# Patient Record
Sex: Female | Born: 2000 | Hispanic: Yes | Marital: Single | Smoking: Never smoker
Health system: Southern US, Community
[De-identification: ages and names within clinical notes are randomized; demographics above are authoritative.]

## PROBLEM LIST (undated history)

## (undated) DIAGNOSIS — Z789 Other specified health status: Secondary | ICD-10-CM

## (undated) HISTORY — PX: NO PAST SURGERIES: SHX2092

---
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
## 2019-01-08 NOTE — L&D Delivery Note (Signed)
Delivery Note  [name] is a G1P0101 at [redacted]w[redacted]d. Patient's last menstrual period was 06/10/2018. which is consistent with early [place] at 6 weeks, Estimated Date of Delivery: 03/17/19.   First Stage: Labor onset: 02/22/19 at 2300 Augmentation : AROM Analgesia /Anesthesia intrapartum: none AROM at 0821  Second Stage: Complete dilation at 0833 Onset of pushing at 0836 FHR second stage 140 with moderate variability, accelerations present, intermittent variables   [name] presented to L&D with regular contractions. She progressed to c/c/+2 and had excellent maternal effort with pushing.  Delivery of a viable boy 02/23/19 at 0849 by CNM, Dr. Feliberto Gottron at bedside, delivery of fetal head in OA position with restitution to LOT. Delivered through loose shoulder cord;  Anterior then posterior shoulders delivered easily with gentle downward traction. Baby placed on mom's chest, and attended to by peds.  Cord double clamped after cessation of pulsation, cut by grandmother of baby Cord blood sample collected   Arterial cord blood sample: NA  Third Stage: Placenta delivered [name] side intact with 3 VC @ 0853 Placenta disposition: discarded Uterine tone firm / bleeding moderate  Left periurethral and hymenal laceration identified  Anesthesia for repair: lidocaine 1% for local Repair with 3-0 Vicryl SH x 2 Est. Blood Loss (mL): 565  Complications: None  Mom to postpartum.  Baby to Couplet care / Skin to Skin.  Newborn: Birth Weight: 5lbs 11oz (2580 grams)  Apgar Scores: 8/9 Feeding planned: breast   [name], CNM

## 2019-02-18 LAB — OB RESULTS CONSOLE VARICELLA ZOSTER ANTIBODY, IGG: Varicella: IMMUNE

## 2019-02-18 LAB — OB RESULTS CONSOLE GBS: GBS: NEGATIVE

## 2019-02-18 LAB — OB RESULTS CONSOLE RPR: RPR: NONREACTIVE

## 2019-02-18 LAB — OB RESULTS CONSOLE RUBELLA ANTIBODY, IGM: Rubella: UNDETERMINED

## 2019-02-18 LAB — OB RESULTS CONSOLE GC/CHLAMYDIA
Chlamydia: NEGATIVE
Gonorrhea: NEGATIVE

## 2019-02-18 LAB — OB RESULTS CONSOLE HIV ANTIBODY (ROUTINE TESTING): HIV: NONREACTIVE

## 2019-02-23 ENCOUNTER — Other Ambulatory Visit: Payer: Self-pay

## 2019-02-23 ENCOUNTER — Inpatient Hospital Stay
Admission: EM | Admit: 2019-02-23 | Discharge: 2019-02-24 | DRG: 806 | Disposition: A | Discharge status: Home / Self Care | Payer: Medicaid Other | Attending: Obstetrics & Gynecology | Admitting: Obstetrics & Gynecology

## 2019-02-23 DIAGNOSIS — O26893 Other specified pregnancy related conditions, third trimester: Secondary | ICD-10-CM | POA: Diagnosis present

## 2019-02-23 DIAGNOSIS — Z20822 Contact with and (suspected) exposure to covid-19: Secondary | ICD-10-CM | POA: Diagnosis present

## 2019-02-23 DIAGNOSIS — Z3A36 36 weeks gestation of pregnancy: Secondary | ICD-10-CM

## 2019-02-23 DIAGNOSIS — O9081 Anemia of the puerperium: Secondary | ICD-10-CM | POA: Diagnosis not present

## 2019-02-23 DIAGNOSIS — D62 Acute posthemorrhagic anemia: Secondary | ICD-10-CM | POA: Diagnosis not present

## 2019-02-23 HISTORY — DX: Other specified health status: Z78.9

## 2019-02-23 LAB — CBC
HCT: 39.7 % (ref 36.0–46.0)
Hemoglobin: 13.4 g/dL (ref 12.0–15.0)
MCH: 30.1 pg (ref 26.0–34.0)
MCHC: 33.8 g/dL (ref 30.0–36.0)
MCV: 89.2 fL (ref 80.0–100.0)
Platelets: 161 10*3/uL (ref 150–400)
RBC: 4.45 MIL/uL (ref 3.87–5.11)
RDW: 12.1 % (ref 11.5–15.5)
WBC: 15.4 10*3/uL — ABNORMAL HIGH (ref 4.0–10.5)
nRBC: 0 % (ref 0.0–0.2)

## 2019-02-23 LAB — URINE DRUG SCREEN, QUALITATIVE (ARMC ONLY)
Amphetamines, Ur Screen: NOT DETECTED
Barbiturates, Ur Screen: NOT DETECTED
Benzodiazepine, Ur Scrn: NOT DETECTED
Cannabinoid 50 Ng, Ur ~~LOC~~: NOT DETECTED
Cocaine Metabolite,Ur ~~LOC~~: NOT DETECTED
MDMA (Ecstasy)Ur Screen: NOT DETECTED
Methadone Scn, Ur: NOT DETECTED
Opiate, Ur Screen: NOT DETECTED
Phencyclidine (PCP) Ur S: NOT DETECTED
Tricyclic, Ur Screen: NOT DETECTED

## 2019-02-23 LAB — TYPE AND SCREEN
ABO/RH(D): O POS
Antibody Screen: NEGATIVE

## 2019-02-23 LAB — RESPIRATORY PANEL BY RT PCR (FLU A&B, COVID)
Influenza A by PCR: NEGATIVE
Influenza B by PCR: NEGATIVE
SARS Coronavirus 2 by RT PCR: NEGATIVE

## 2019-02-23 MED ORDER — DIBUCAINE (PERIANAL) 1 % EX OINT
1.0000 "application " | TOPICAL_OINTMENT | CUTANEOUS | Status: DC | PRN
  Filled 2019-02-23 (×2): qty 28

## 2019-02-23 MED ORDER — OXYTOCIN 10 UNIT/ML IJ SOLN
INTRAMUSCULAR | Status: AC
  Filled 2019-02-23: qty 2

## 2019-02-23 MED ORDER — WITCH HAZEL-GLYCERIN EX PADS
1.0000 "application " | MEDICATED_PAD | CUTANEOUS | Status: DC
  Administered 2019-02-23: 1 via TOPICAL
  Filled 2019-02-23 (×2): qty 100

## 2019-02-23 MED ORDER — ONDANSETRON HCL 4 MG PO TABS: 4.0000 mg | ORAL_TABLET | ORAL | Status: DC | PRN

## 2019-02-23 MED ORDER — MISOPROSTOL 200 MCG PO TABS
ORAL_TABLET | ORAL | Status: AC
  Filled 2019-02-23: qty 4

## 2019-02-23 MED ORDER — OXYTOCIN 40 UNITS IN NORMAL SALINE INFUSION - SIMPLE MED
INTRAVENOUS | Status: AC
  Filled 2019-02-23: qty 1000

## 2019-02-23 MED ORDER — LIDOCAINE HCL (PF) 1 % IJ SOLN
INTRAMUSCULAR | Status: AC
  Filled 2019-02-23: qty 30

## 2019-02-23 MED ORDER — OXYTOCIN 40 UNITS IN NORMAL SALINE INFUSION - SIMPLE MED
2.5000 [IU]/h | INTRAVENOUS | Status: DC
  Administered 2019-02-23: 09:00:00 2.5 [IU]/h via INTRAVENOUS

## 2019-02-23 MED ORDER — BENZOCAINE-MENTHOL 20-0.5 % EX AERO
INHALATION_SPRAY | CUTANEOUS | Status: AC
  Filled 2019-02-23: qty 56

## 2019-02-23 MED ORDER — IBUPROFEN 600 MG PO TABS: 600.0000 mg | ORAL_TABLET | Freq: Four times a day (QID) | ORAL | Status: DC

## 2019-02-23 MED ORDER — LIDOCAINE HCL (PF) 1 % IJ SOLN
30.0000 mL | INTRAMUSCULAR | Status: AC | PRN
  Administered 2019-02-23: 30 mL via SUBCUTANEOUS

## 2019-02-23 MED ORDER — ONDANSETRON HCL 4 MG/2ML IJ SOLN: 4.0000 mg | INTRAMUSCULAR | Status: DC | PRN

## 2019-02-23 MED ORDER — OXYTOCIN BOLUS FROM INFUSION
500.0000 mL | Freq: Once | INTRAVENOUS | Status: AC
  Administered 2019-02-23: 09:00:00 500 mL via INTRAVENOUS

## 2019-02-23 MED ORDER — COCONUT OIL OIL
1.0000 "application " | TOPICAL_OIL | Status: DC | PRN
  Filled 2019-02-23: qty 120

## 2019-02-23 MED ORDER — LACTATED RINGERS IV SOLN: INTRAVENOUS | Status: DC

## 2019-02-23 MED ORDER — ACETAMINOPHEN 325 MG PO TABS: 650.0000 mg | ORAL_TABLET | ORAL | Status: DC | PRN

## 2019-02-23 MED ORDER — DOCUSATE SODIUM 100 MG PO CAPS: 100.0000 mg | ORAL_CAPSULE | Freq: Two times a day (BID) | ORAL | Status: DC

## 2019-02-23 MED ORDER — PRENATAL MULTIVITAMIN CH
1.0000 | ORAL_TABLET | Freq: Every day | ORAL | Status: DC
  Administered 2019-02-24: 12:00:00 1 via ORAL
  Filled 2019-02-23: qty 1

## 2019-02-23 MED ORDER — ACETAMINOPHEN 500 MG PO TABS
1000.0000 mg | ORAL_TABLET | Freq: Four times a day (QID) | ORAL | Status: DC | PRN
  Filled 2019-02-23: qty 2

## 2019-02-23 MED ORDER — LACTATED RINGERS IV SOLN: 500.0000 mL | INTRAVENOUS | Status: DC | PRN

## 2019-02-23 MED ORDER — MEASLES, MUMPS & RUBELLA VAC IJ SOLR
0.5000 mL | Freq: Once | INTRAMUSCULAR | Status: AC
  Administered 2019-02-24: 0.5 mL via SUBCUTANEOUS
  Filled 2019-02-23 (×2): qty 0.5

## 2019-02-23 MED ORDER — AMMONIA AROMATIC IN INHA
RESPIRATORY_TRACT | Status: AC
  Filled 2019-02-23: qty 10

## 2019-02-23 MED ORDER — SIMETHICONE 80 MG PO CHEW: 80.0000 mg | CHEWABLE_TABLET | ORAL | Status: DC | PRN

## 2019-02-23 MED ORDER — SOD CITRATE-CITRIC ACID 500-334 MG/5ML PO SOLN: 30.0000 mL | ORAL | Status: DC | PRN

## 2019-02-23 MED ORDER — OXYCODONE-ACETAMINOPHEN 5-325 MG PO TABS: 1.0000 | ORAL_TABLET | ORAL | Status: DC | PRN

## 2019-02-23 MED ORDER — FENTANYL CITRATE (PF) 100 MCG/2ML IJ SOLN: 50.0000 ug | INTRAMUSCULAR | Status: DC | PRN

## 2019-02-23 MED ORDER — ONDANSETRON HCL 4 MG/2ML IJ SOLN: 4.0000 mg | Freq: Four times a day (QID) | INTRAMUSCULAR | Status: DC | PRN

## 2019-02-23 MED ORDER — DIPHENHYDRAMINE HCL 25 MG PO CAPS: 25.0000 mg | ORAL_CAPSULE | Freq: Four times a day (QID) | ORAL | Status: DC | PRN

## 2019-02-23 MED ORDER — OXYCODONE-ACETAMINOPHEN 5-325 MG PO TABS: 2.0000 | ORAL_TABLET | ORAL | Status: DC | PRN

## 2019-02-23 MED ORDER — BENZOCAINE-MENTHOL 20-0.5 % EX AERO
1.0000 "application " | INHALATION_SPRAY | CUTANEOUS | Status: DC | PRN
  Filled 2019-02-23 (×2): qty 56

## 2019-02-23 NOTE — H&P (Signed)
OB History & Physical   History of Present Illness:  Chief Complaint:   HPI:  [name] is a 19 y.o. G1P0 female at [redacted]w[redacted]d dated by LMP (06/10/18) and consistent with a 6 week u/s.  She presents to L&D for labor evaluation.  She reports UC all day yesterday however they became q46min about 2300 last night.   She reports:  -active fetal movement -no leakage of fluid   -no vaginal bleeding -onset of contractions at 0830 yesterday and currently every 3 minutes  Pregnancy Issues: 1. Rubella Equivocal 2. Elevated 1h OGTT, 3h WNL  Maternal Medical History:   Past Medical History:  Diagnosis Date  . Medical history non-contributory     History reviewed. No pertinent surgical history.  No Known Allergies  Prior to Admission medications   Medication Sig Start Date End Date Taking? Authorizing Provider  Prenatal Vit-Fe Fumarate-FA (MULTIVITAMIN-PRENATAL) 27-0.8 MG TABS tablet Take 1 tablet by mouth daily at 12 noon.   Yes [provider]     Prenatal care site: [facility]   Social History: She  reports that she has never smoked. She has never used smokeless tobacco. She reports previous alcohol use. She reports previous drug use.  Family History: family history is not on file.   Review of Systems: A full review of systems was performed and negative except as noted in the HPI.    Physical Exam:  Vital Signs: Pulse 83   Temp 98.7 F (37.1 C) (Oral)   Resp 18   Ht 5' (1.524 m)   Wt 62.6 kg   LMP 06/10/2018   BMI 26.95 kg/m   General:   alert and cooperative  Skin:  normal  Neurologic:    Alert & oriented x 3  Lungs:   nl effort  Heart:   regular rate and rhythm  Abdomen:  non-tender, soft between contractions  Pelvis:  Exam deferred.  FHT:  145 BPM  Presentations: cephalic  Cervix:    Dilation: 4 cm   Effacement: 80%   Station:  -2   Consistency: soft   Position: middle  Extremities: : non-tender, symmetric     No results found  for this or any previous visit (from the past 24 hour(s)).  Pertinent Results:  Prenatal Labs: Blood type/Rh O pos  Antibody screen neg  Rubella Equivocal  Varicella Immune  RPR NR  HBsAg Neg  HIV NR  GC neg  Chlamydia neg  Genetic screening negative  1 hour GTT 169  3 hour GTT WNL 93, 155, 127, 115  GBS neg   FHT: FHR: 145 bpm, variability: moderate,  accelerations:  Present,  decelerations:  Absent Category/reactivity:  Category I TOCO: regular, every 2 minutes SVE: Dilation: 6.5 / Effacement (%): 90 / Station: -1    Cephalic by SVE  No results found.   Assessment:  [name] is a 19 y.o. G1P0 female at [redacted]w[redacted]d with contractions admitted in labor.   Plan:  1. Admit to Labor & Delivery; consents reviewed and obtained  2. Fetal Well being  - Fetal Tracing: Cat I - GBS neg - Presentation: vtx confirmed by SVE   3. Routine OB: - Prenatal labs reviewed, as above - Rh pos - CBC & T&S on admit - Clear fluids, IVF  4. Monitoring of Labor -  Contractions monitor with external toco in place -  Plan for continuous fetal monitoring  -  Maternal pain control as desired: Pt plans to labor without  epidural - Anticipate vaginal delivery  5. Post Partum Planning: - Infant feeding: Breast - Contraception: Condoms  Deserai Cansler, CNM 02/23/2019 5:09 AM

## 2019-02-23 NOTE — OB Triage Note (Signed)
Pt 18, G1P0, [redacted]w[redacted]d, presents to L&D w/ c/o ctxs q5 min apart. Reports she has been feeling irregular ctxs since 8:30 am 02/22/2019. Reports pain as a 10/10. Pt denies vaginal bleeding and LOF. Reports fetal movement. Monitors applied and assessing, FHT 150bpm, VSS. Will continue to monitor.

## 2019-02-23 NOTE — Lactation Note (Signed)
This note was copied from a baby's chart. Lactation Consultation Note  Patient Name: [name] [id number] Date: 02/23/2019 Reason for consult: Initial assessment;Primapara;1st time breastfeeding;Late-preterm 34-36.6wks;Infant < 6lbs  LC called into LDR2 by Transition to assist first time mom with breastfeeding. Mom delivered SVD 45 minutes prior, to baby Beth at 36 weeks 6 days gestation. Baby was skin to skin on mom's chest displaying early hunger cues. LC assisted and talked through steps in transitioning baby to left breast in modified cross-cradle hold, sandwiching of breast tissue. Baby [name] latched easily with flanged top and bottom lips, LC continued to compress and massage the breast tissue as [name] displayed strong rhythmic sucking pattern with a few audible swallows. Mom educated on typical newborn feeding behaviors: on/off breast, multiple attempts to latch, sandwiching tissue for deep latch. Reviewed early hunger cues, feeding on cue, and benefits of skin to skin.Baby fed well on both left and right breasts, mom reporting tugging sensation and no discomfort. LC will follow-up with mom once moved to [facility]. Transition RN updated on feeding.  Maternal Data Formula Feeding for Exclusion: No Has patient been taught Hand Expression?: Yes Does the patient have breastfeeding experience prior to this delivery?: No  Feeding Feeding Type: Breast Fed  LATCH Score Latch: Grasps breast easily, tongue down, lips flanged, rhythmical sucking.  Audible Swallowing: A few with stimulation  Type of Nipple: Everted at rest and after stimulation  Comfort (Breast/Nipple): Soft / non-tender  Hold (Positioning): Assistance needed to correctly position infant at breast and maintain latch.  LATCH Score: 8  Interventions Interventions: Breast feeding basics reviewed;Assisted with latch;Breast massage;Hand express;Breast compression;Adjust position;Support pillows  Lactation Tools  Discussed/Used     Consult Status Consult Status: Follow-up Date: 02/23/19 Follow-up type: In-patient    Danford Bad 02/23/2019, 10:15 AM

## 2019-02-23 NOTE — Plan of Care (Signed)
Patient had successful vaginal delivery at 0849 this morning to baby boy. Patient recovering well in Labor and Delivery and planning to transfer mom and baby boy to mother baby for couplet care. Pain managed well and all questions answered by RN.

## 2019-02-23 NOTE — Lactation Note (Signed)
This note was copied from a baby's chart. Lactation Consultation Note  Patient Name: [name] [id number] Date: 02/23/2019 Reason for consult: Follow-up assessment;Mother's request;1st time breastfeeding;Primapara;Late-preterm 34-36.6wks;Infant < 6lbs  Mom called out for [facility] support in positioning and latching of [name]. Mom had attempted earlier, but says he was too sleepy. LC assisted mom in getting comfortable, placed support pillows for cross-cradle hold, and assisted with unwrapping baby. Mom was able to independently get baby to breast, and use opposite hand for sandwiching of the tissue. A few attempts with latching, a couple of strong sucks, and [name] appeared to fall asleep at the breast. LC educated mom on typical newborn feeding patterns, void/stool expectations for first 24 hours, and attempts 8-12x a day. Discussed benefits of implementing a DEBP due to baby's age, tendency to be sleepier at the breast, and differences in suction ability with age versus 40 weekers. Mom understanding the benefit of pumping but desires to wait a little so she can rest. LC acknowledged request; will update RN.  Maternal Data Formula Feeding for Exclusion: No Has patient been taught Hand Expression?: Yes Does the patient have breastfeeding experience prior to this delivery?: No  Feeding Feeding Type: Breast Fed  LATCH Score Latch: Too sleepy or reluctant, no latch achieved, no sucking elicited.  Audible Swallowing: None  Type of Nipple: Everted at rest and after stimulation  Comfort (Breast/Nipple): Soft / non-tender  Hold (Positioning): Assistance needed to correctly position infant at breast and maintain latch.  LATCH Score: 5  Interventions Interventions: Breast feeding basics reviewed;Assisted with latch;Skin to skin;Adjust position;Breast compression;Support pillows  Lactation Tools Discussed/Used     Consult Status Consult Status: Follow-up Date: 02/23/19 Follow-up  type: In-patient    Danford Bad 02/23/2019, 3:33 PM

## 2019-02-23 NOTE — Discharge Summary (Signed)
Obstetrical Discharge Summary  Patient Name: [name] DOB: 06/29/2000 MRN: [id number]  Date of Admission: 02/23/2019 Date of Delivery: 02/23/2019 Delivered by: Drinda Butts, CNM, Dr. Ouida Sills present for birth  Date of Discharge: 02/24/2019  Primary OB: [place] URK:[id number]'S last menstrual period was 06/10/2018. EDC Estimated Date of Delivery: 03/17/19 Gestational Age at Delivery: [redacted]w[redacted]d   Antepartum complications:  1. Preterm labor at [redacted]w[redacted]d 2. Rubella Equivocal  3. Elevated 1 hour OGTT - 3hr normal Admitting Diagnosis:  Preterm labor at [redacted]w[redacted]d Secondary Diagnosis: Patient Active Problem List   Diagnosis Date Noted  . Normal labor 02/23/2019   Augmentation: AROM Complications: None Intrapartum complications/course: [name] present to L&D with regular contractions and preterm labor at [redacted]w[redacted]d.  She progressed to c/c/+2, unmedicated, good maternal effort with pushing.  SVD of viable baby boy.   Delivery Type: spontaneous vaginal delivery Anesthesia: Local anesthetic for repair  Placenta: spontaneous Laceration: left hymenal laceration, left periurethral w/ repeair  Episiotomy: none Newborn Data: Live born female Birth Weight: 5lbs 11oz (2831D) APGAR: 8, 9  Newborn Delivery   Birth date/time: 02/23/2019 08:49:00 Delivery type: Vaginal, Spontaneous     Postpartum Procedures: none  Post partum course:  Patient had an uncomplicated postpartum course.  By time of discharge on PPD#1, her pain was controlled on oral pain medications; she had appropriate lochia and was ambulating, voiding without difficulty and tolerating regular diet.  She was deemed stable for discharge to home.     Discharge Physical Exam:  BP 104/63 (BP Location: Left Arm)   Pulse 84   Temp 98 F (36.7 C) (Oral)   Resp 16   Ht 5' (1.524 m)   Wt 62.6 kg   LMP 06/10/2018   SpO2 100%   Breastfeeding Unknown   BMI 26.95 kg/m   General: NAD CV: RRR Pulm: CTABL, nl effort ABD:  s/nd/nt, fundus firm and below the umbilicus Lochia: moderate DVT Evaluation: LE non-ttp, no evidence of DVT on exam.  Hemoglobin  Date Value Ref Range Status  02/24/2019 11.5 (L) 12.0 - 15.0 g/dL Final   HCT  Date Value Ref Range Status  02/24/2019 34.2 (L) 36.0 - 46.0 % Final   Disposition: stable, discharge to home. Baby Feeding: breastmilk Baby Disposition: home with mom  Rh Immune globulin given: N/A Pos RH Rubella vaccine given: ordered to be given Tdap vaccine given in AP or PP setting: Tdap given 12/24/18 Flu vaccine given in AP or PP setting: Flu given 10/20/18  Contraception: condoms  Prenatal Labs:  Blood type/Rh O pos  Antibody screen neg  Rubella Equivocal  Varicella Immune  RPR NR  HBsAg Neg  HIV NR  GC neg  Chlamydia neg  Genetic screening negative  1 hour GTT 169  3 hour GTT WNL 93, 155, 127, 115  GBS neg    Plan:  [name] was discharged to home in good condition. Follow-up appointment with delivering provider in 6 weeks.  Discharge Medications: Allergies as of 02/24/2019   No Known Allergies     Medication List    TAKE these medications   acetaminophen 500 MG tablet Commonly known as: TYLENOL Take 2 tablets (1,000 mg total) by mouth every 6 (six) hours as needed for mild pain or moderate pain.   ibuprofen 600 MG tablet Commonly known as: ADVIL Take 1 tablet (600 mg total) by mouth every 6 (six) hours as needed for mild pain, moderate pain or cramping.   multivitamin-prenatal 27-0.8 MG Tabs tablet Take 1  tablet by mouth daily at 12 noon.       Follow-up Information    Gustavo Lah, CNM Follow up in 6 week(s).   Specialty: Certified Nurse Midwife Contact information: [address] [place] [address] [phone]          Signed: Genia Del, CNM 02/24/2019 11:15 AM

## 2019-02-24 LAB — CBC
HCT: 34.2 % — ABNORMAL LOW (ref 36.0–46.0)
Hemoglobin: 11.5 g/dL — ABNORMAL LOW (ref 12.0–15.0)
MCH: 30.5 pg (ref 26.0–34.0)
MCHC: 33.6 g/dL (ref 30.0–36.0)
MCV: 90.7 fL (ref 80.0–100.0)
Platelets: 153 10*3/uL (ref 150–400)
RBC: 3.77 MIL/uL — ABNORMAL LOW (ref 3.87–5.11)
RDW: 12.2 % (ref 11.5–15.5)
WBC: 12.3 10*3/uL — ABNORMAL HIGH (ref 4.0–10.5)
nRBC: 0 % (ref 0.0–0.2)

## 2019-02-24 LAB — RPR: RPR Ser Ql: NONREACTIVE

## 2019-02-24 MED ORDER — IBUPROFEN 600 MG PO TABS: 600.0000 mg | ORAL_TABLET | Freq: Four times a day (QID) | ORAL | 0 refills | Status: DC | PRN

## 2019-02-24 MED ORDER — ACETAMINOPHEN 500 MG PO TABS: 1000.0000 mg | ORAL_TABLET | Freq: Four times a day (QID) | ORAL | 0 refills | Status: DC | PRN

## 2019-02-24 NOTE — Discharge Instructions (Signed)
After Your Delivery Discharge Instructions   Postpartum: Care Instructions  After childbirth (postpartum period), your body goes through many changes. Some of these changes happen over several weeks. In the hours after delivery, your body will begin to recover from childbirth while it prepares to breastfeed your newborn. You may feel emotional during this time. Your hormones can shift your mood without warning for no clear reason.  In the first couple of weeks after childbirth, many women have emotions that change from happy to sad. You may find it hard to sleep. You may cry a lot. This is called the "baby blues." These overwhelming emotions often go away within a couple of days or weeks. But it's important to discuss your feelings with your doctor.  You should call your care provider if you have unrelieved feelings of:  Inability to cope  Sadness  Anxiety  Lack of interest in baby  Insomnia  Crying  It is easy to get too tired and overwhelmed during the first weeks after childbirth. Don't try to do too much. Get rest whenever you can, accept help from others, and eat well and drink plenty of fluids.  About 4 to 6 weeks after your baby's birth, you will have a follow-up visit with your care provider. This visit is your time to talk to your provider about anything you are concerned or curious about.  Follow-up care is a key part of your treatment and safety. Be sure to make and go to all appointments, and call your doctor if you are having problems. It's also a good idea to know your test results and keep a list of the medicines you take.  How can you care for yourself at home?  Sleep or rest when your baby sleeps.  Get help with household chores from family or friends, if you can. Do not try to do it all yourself.  If you have hemorrhoids or swelling or pain around the opening of your vagina, try using cold and heat. You can put ice or a cold pack on the area for 10 to 20 minutes at  a time. Put a thin cloth between the ice and your skin. Also try sitting in a few inches of warm water (sitz bath) 3 times a day and after bowel movements.  Take pain medicines exactly as directed.  If the provider gave you a prescription medicine for pain, take it as prescribed.  If you do not have a prescription and need something over the counter, you can take:  Ibuprofen (Motrin, Advil) up to 600mg  every 6 hours as needed for pain  Acetaminophen (Tylenol) up to 1000mg  every 6 hours as needed for pain  Some people find it helpful to alternate between these two medications.   No driving for 1-2 weeks or while taking pain medications.   Eat more fiber to avoid constipation. Include foods such as whole-grain breads and cereals, raw vegetables, raw and dried fruits, and beans.  Drink plenty of fluids, enough so that your urine is light yellow or clear like water. If you have kidney, heart, or liver disease and have to limit fluids, talk with your doctor before you increase the amount of fluids you drink.  Do not put anything in the vagina for 6 weeks. This means no sex, no tampons, no douching, and no enemas.  If you have stitches, keep the area clean by pouring or spraying warm water over the area outside your vagina and anus after you use the toilet.  No strenuous activity or heavy lifting for 6 weeks   No tub baths; showers only  Continue prenatal vitamin and iron.  If breastfeeding:  Increase calories and fluids while breastfeeding.  You may have a slight fever when your milk comes in, but it should go away on its own. If it does not, and rises above 101.0 please call the doctor.  For breastfeeding concerns, the lactation consultant can be reached at [phone].  For concerns about your baby, please call your pediatrician.   Keep a list of questions to bring to your postpartum visit. Your questions might be about:  Changes in your breasts, such as lumps or  soreness.  When to expect your menstrual period to start again.  What form of birth control is best for you.  Weight you have put on during the pregnancy.  Exercise options.  What foods and drinks are best for you, especially if you are breastfeeding.  Problems you might be having with breastfeeding.  When you can have sex. Some women may want to talk about lubricants for the vagina.  Any feelings of sadness or restlessness that you are having.   When should you call for help?  Call 911 anytime you think you may need emergency care. For example, call if:  You have thoughts of harming yourself, your baby, or another person.  You passed out (lost consciousness).  Call the office at [phone] or seek immediate medical care if:  If you have heavy bleeding such that you are soaking 1 pad in an hour for 2 hours  You are dizzy or lightheaded, or you feel like you may faint.  You have a fever; a temperature of 101.0 F or greater  Chills  Difficulty urinating  Headache unrelieved by "pain meds"   Visual changes  Pain in the right side of your belly near your ribs  Breasts reddened, hard, hot to the touch or any other breast concerns  Nipple discharge which is foul-smelling or contains pus   New pain unrelieved with recommended over-the-counter dosages  Difficulty breathing with or without chest pain   New leg pain, swelling, or redness, especially if it is only on one leg  Any other concerns  Watch closely for changes in your health, and be sure to contact your provider if:  You have new or worse vaginal discharge.  You feel sad or depressed.  You are having problems with your breasts or breastfeeding.       Breastfeeding Tips for a Good Latch Latching is how your baby's mouth attaches to your nipple to breastfeed. It is an important part of breastfeeding. Your baby may have trouble latching for a number of reasons. A poor latch may cause you to have  cracked or sore nipples or other problems. Follow these instructions at home: How to position your baby  Find a comfortable place to sit or lie down. Your neck and back should be well supported.  If you are seated, place a pillow or rolled-up blanket under your baby. This will bring him or her to the level of your breast.  Make sure that your baby's belly (abdomen) is facing your belly.  Try different positions to find one that works best for you and your baby. How to help your baby latch   To start, gently rub your breast. Move your fingertips in a circle as you massage from your chest wall toward your nipple. This helps milk flow. Keep doing this during feeding if needed.  Position your breast. Hold your breast with four fingers underneath and your thumb above your nipple. Keep your fingers away from your nipple and your baby's mouth. Follow these steps to help your baby latch: 1. Rub your baby's lips gently with your finger or nipple. 2. When your baby's mouth is open wide enough, quickly bring your baby to your breast and place your whole nipple into your baby's mouth. Place as much of the colored area around your nipple (areola)as possible into your baby's mouth. 3. Your baby's tongue should be between his or her lower gum and your breast. 4. You should be able to see more areola above your baby's upper lip than below the lower lip. 5. When your baby starts sucking, you will feel a gentle pull on your nipple. You should not feel any pain. Be patient. It is common for a baby to suck for about 2-3 minutes to start the flow of breast milk. 6. Make sure that your baby's mouth is in the right position around your nipple. Your baby's lips should make a seal on your breast and be turned outward.  General instructions  Look for these signs that your baby has latched on to your nipple: ? The baby is quietly tugging or sucking without causing you pain. ? You hear the baby swallow after every  3 or 4 sucks. ? You see movement above and in front of the baby's ears while he or she is sucking.  Be aware of these signs that your baby has not latched on to your nipple: ? The baby makes sucking sounds or smacking sounds while feeding. ? You have nipple pain.  If your baby is not latched well, put your little finger between your baby's gums and your nipple. This will break the seal. Then try to help your baby latch again.  If you keep having problems, get help from a breastfeeding specialist (Science writer). Contact a doctor if:  You have cracking or soreness in your nipples that lasts longer than 1 week.  You have nipple pain.  Your breasts are filled with too much milk (engorgement), and this does not improve after 48-72 hours.  You have a plugged milk duct and a fever.  You follow the tips for a good latch but you keep having problems or concerns.  You have a pus-like fluid coming from your breast.  Your baby is not gaining weight.  Your baby loses weight. Summary  Latching is how your baby's mouth attaches to your nipple to breastfeed.  Try different positions for breastfeeding to find one that works best for you and your baby.  A poor latch may cause you to have cracked or sore nipples or other problems. This information is not intended to replace advice given to you by your health care provider. Make sure you discuss any questions you have with your health care provider. Document Revised: 04/15/2018 Document Reviewed: 07/31/2016 Elsevier Patient Education  [place].  Vaginal Delivery, Care After Refer to this sheet in the next few weeks. These discharge instructions provide you with information on caring for yourself after delivery. Your caregiver may also give you specific instructions. Your treatment has been planned according to the most current medical practices available, but problems sometimes occur. Call your caregiver if you have any problems  or questions after you go home. HOME CARE INSTRUCTIONS 1. Take over-the-counter or prescription medicines only as directed by your caregiver or pharmacist. 2. Do not drink alcohol, especially if  you are breastfeeding or taking medicine to relieve pain. 3. Do not smoke tobacco. 4. Continue to use good perineal care. Good perineal care includes: 1. Wiping your perineum from back to front 2. Keeping your perineum clean. 3. You can do sitz baths twice a day, to help keep this area clean 5. Do not use tampons, douche or have sex until your caregiver says it is okay. 6. Shower only and avoid sitting in submerged water, aside from sitz baths 7. Wear a well-fitting bra that provides breast support. 8. Eat healthy foods. 9. Drink enough fluids to keep your urine clear or pale yellow. 10. Eat high-fiber foods such as whole grain cereals and breads, brown rice, beans, and fresh fruits and vegetables every day. These foods may help prevent or relieve constipation. 11. Avoid constipation with high fiber foods or medications, such as miralax or metamucil 12. Follow your caregiver's recommendations regarding resumption of activities such as climbing stairs, driving, lifting, exercising, or traveling. 13. Talk to your caregiver about resuming sexual activities. Resumption of sexual activities is dependent upon your risk of infection, your rate of healing, and your comfort and desire to resume sexual activity. 14. Try to have someone help you with your household activities and your newborn for at least a few days after you leave the hospital. 15. Rest as much as possible. Try to rest or take a nap when your newborn is sleeping. 16. Increase your activities gradually. 17. Keep all of your scheduled postpartum appointments. It is very important to keep your scheduled follow-up appointments. At these appointments, your caregiver will be checking to make sure that you are healing physically and emotionally. SEEK  MEDICAL CARE IF:   You are passing large clots from your vagina. Save any clots to show your caregiver.  You have a foul smelling discharge from your vagina.  You have trouble urinating.  You are urinating frequently.  You have pain when you urinate.  You have a change in your bowel movements.  You have increasing redness, pain, or swelling near your vaginal incision (episiotomy) or vaginal tear.  You have pus draining from your episiotomy or vaginal tear.  Your episiotomy or vaginal tear is separating.  You have painful, hard, or reddened breasts.  You have a severe headache.  You have blurred vision or see spots.  You feel sad or depressed.  You have thoughts of hurting yourself or your newborn.  You have questions about your care, the care of your newborn, or medicines.  You are dizzy or light-headed.  You have a rash.  You have nausea or vomiting.  You were breastfeeding and have not had a menstrual period within 12 weeks after you stopped breastfeeding.  You are not breastfeeding and have not had a menstrual period by the 12th week after delivery.  You have a fever. SEEK IMMEDIATE MEDICAL CARE IF:   You have persistent pain.  You have chest pain.  You have shortness of breath.  You faint.  You have leg pain.  You have stomach pain.  Your vaginal bleeding saturates two or more sanitary pads in 1 hour. MAKE SURE YOU:   Understand these instructions.  Will watch your condition.  Will get help right away if you are not doing well or get worse. Document Released: 12/22/1999 Document Revised: 05/10/2013 Document Reviewed: 08/21/2011 [facility] Patient Information 2015 [place], [place]. This information is not intended to replace advice given to you by your health care provider. Make sure you discuss any  questions you have with your health care provider.  Sitz Bath A sitz bath is a warm water bath taken in the sitting position. The water covers only the  hips and butt (buttocks). We recommend using one that fits in the toilet, to help with ease of use and cleanliness. It may be used for either healing or cleaning purposes. Sitz baths are also used to relieve pain, itching, or muscle tightening (spasms). The water may contain medicine. Moist heat will help you heal and relax.  HOME CARE  Take 3 to 4 sitz baths a day. 18. Fill the bathtub half-full with warm water. 19. Sit in the water and open the drain a little. 20. Turn on the warm water to keep the tub half-full. Keep the water running constantly. 21. Soak in the water for 15 to 20 minutes. 22. After the sitz bath, pat the affected area dry. GET HELP RIGHT AWAY IF: You get worse instead of better. Stop the sitz baths if you get worse. MAKE SURE YOU:  Understand these instructions.  Will watch your condition.  Will get help right away if you are not doing well or get worse. Document Released: 02/01/2004 Document Revised: 09/18/2011 Document Reviewed: 04/23/2010 [facility] Patient Information 2015 [place], [place]. This information is not intended to replace advice given to you by your health care provider. Make sure you discuss any questions you have with your health care provider.

## 2019-02-24 NOTE — Progress Notes (Signed)
Newborn discharged home.  Discharge instructions and appointment given to and reviewed with parent.  Parent verbalized understanding. All testing completed. Tag removed, bands matched. Escorted by staff, carseat present. 

## 2019-02-24 NOTE — Progress Notes (Signed)
RNCM assessed patient at bedside. Patient verbalizing questions about how to assure her son is set up with Medicaid. Encouraged patient to contact her Medicaid Social Worker to follow up on this and gave her the phone number for DSS. Patient was appreciative.  Patient reports that she lives with her grandmother who is her main support system and that she will pick her up today. She reports that she has all needed equipment in the home and plans to use [facility]. She reports that she does drive and has adequate transportation. Patient verbalizes no other needs at this time.

## 2019-02-24 NOTE — Progress Notes (Addendum)
Purple Cry and CPR videos provided to pt to watch.  Marylouise Stacks, Student RN 02/24/2019 [phone]

## 2019-02-24 NOTE — Lactation Note (Signed)
This note was copied from a baby's chart. Lactation Consultation Note  Patient Name: Boy Leronda Lewers [id number] Date: 02/24/2019 Reason for consult: Follow-up assessment  LC and LC student entered for follow-up visit with mom and baby [name]. Baby was sleeping and about to have his 24 hour testing done. LC discussed decision to include formula in baby's diet, and confirmed that mom needs to include formula as she must return to school. LC student reviewed formula preparation, pace bottle feeding, and incorporating formula into baby's diet.  [employer] student reviewed output expectations, stomach size, and cluster feeding behaviors. Mom has no questions about breast care. LC student encouraged mom to call [facility] lactation for any future questions or concerns.  LC returned to room to confirm that mom needs a referral for a [facility] loaner pump.     Maternal Data Formula Feeding for Exclusion: No  Feeding Feeding Type: Breast Fed  LATCH Score                   Interventions Interventions: Breast feeding basics reviewed  Lactation Tools Discussed/Used WIC Program: Yes   Consult Status Consult Status: Complete Date: 02/24/19 Follow-up type: Call as needed    Willa Rough Prachi Oftedahl 02/24/2019, 10:24 AM

## 2019-02-24 NOTE — Progress Notes (Signed)
Post Partum Day 1  Subjective: Doing well, no concerns. Ambulating without difficulty, pain managed with PO meds, tolerating regular diet, and voiding without difficulty.   No fever/chills, chest pain, shortness of breath, nausea/vomiting, or leg pain. No nipple or breast pain.   Objective: BP 104/63 (BP Location: Left Arm)   Pulse 84   Temp 98 F (36.7 C) (Oral)   Resp 16   Ht 5' (1.524 m)   Wt 62.6 kg   LMP 06/10/2018   SpO2 100%   Breastfeeding Unknown   BMI 26.95 kg/m    Physical Exam:  General: alert, cooperative, appears stated age and no distress Breasts: soft/nontender CV: RRR Pulm: nl effort, CTABL Abdomen: soft, non-tender, active bowel sounds Uterine Fundus: firm Lochia: appropriate DVT Evaluation: No evidence of DVT seen on physical exam. No cords or calf tenderness. No significant calf/ankle edema.  Recent Labs    02/23/19 0604 02/24/19 0644  HGB 13.4 11.5*  HCT 39.7 34.2*  WBC 15.4* 12.3*  PLT 161 153    Assessment/Plan: 19 y.o. G1P0101 postpartum day # 1  -Continue routine postpartum care -Lactation consult PRN for breastfeeding  -Plans condoms for contraception -Mild blood loss anemia - hemodynamically stable and asymptomatic; continue prenatal vitamin with iron and stool softeners  -Immunization status: Offer MMR prior to discharge  Disposition: Desires discharge home today if baby cleared for discharge   LOS: 1 day   Lisette Grinder, CNM 02/24/2019, 8:47 AM   ----- Lisette Grinder Certified Nurse Midwife [facility]

## 2020-10-06 IMAGING — US OBSTETRIC <14 WK ULTRASOUND
1 series · 14 of 28 positions shown · non-contrast
Comparison: None.

CLINICAL DATA: 18-year-old female with bleeding in the 1st
trimester of pregnancy. Quantitative beta HCG 31,000 919.

EXAM:
OBSTETRIC <14 WK US AND TRANSVAGINAL OB US
TECHNIQUE: Both transabdominal and transvaginal ultrasound examinations were
performed for complete evaluation of the gestation as well as the
maternal uterus, adnexal regions, and pelvic cul-de-sac.
Transvaginal technique was performed to assess early pregnancy.

[Series 1: obstetric <14 wk ultrasound · 14 of 42 slices shown]
[im 2/42]
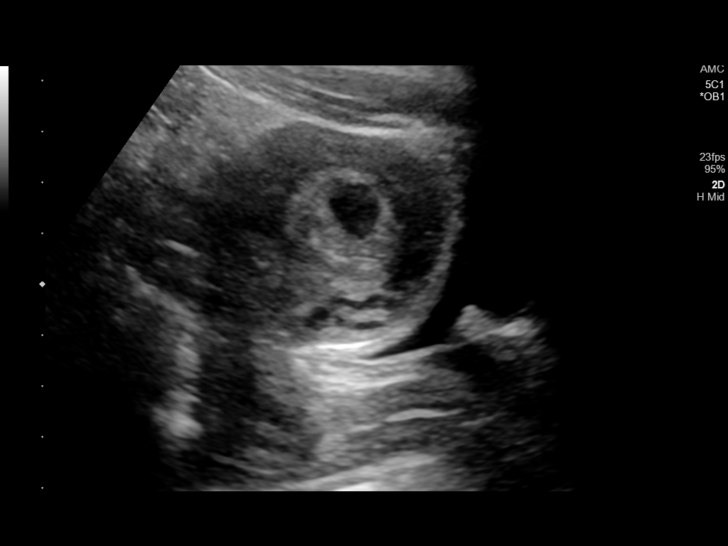
[im 5/42]
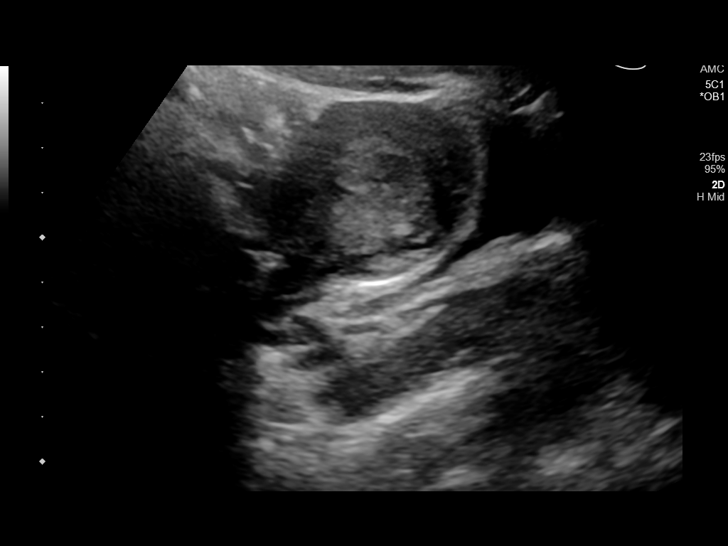
[im 8/42]
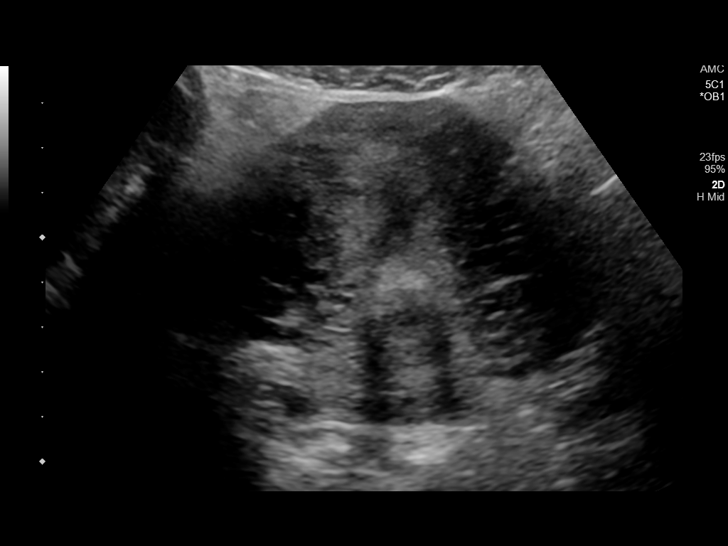
[im 11/42]
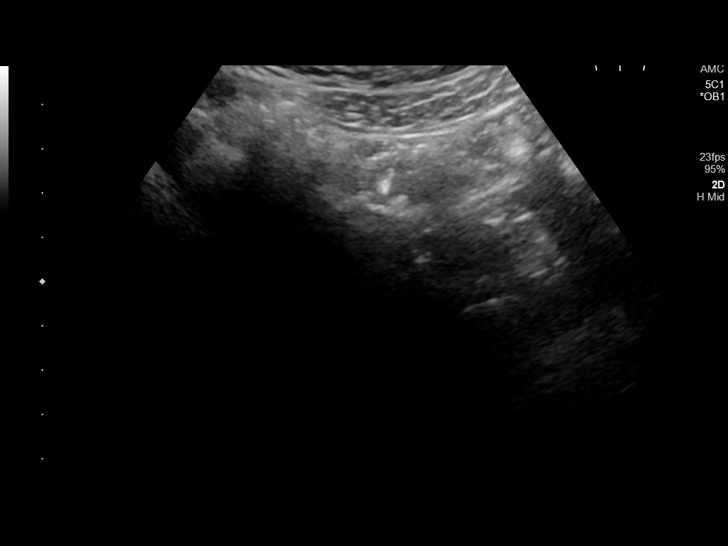
[im 14/42]
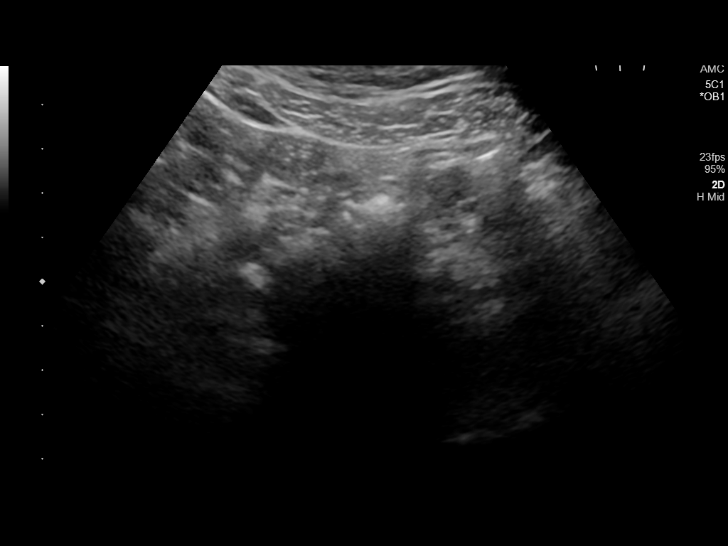
[im 17/42]
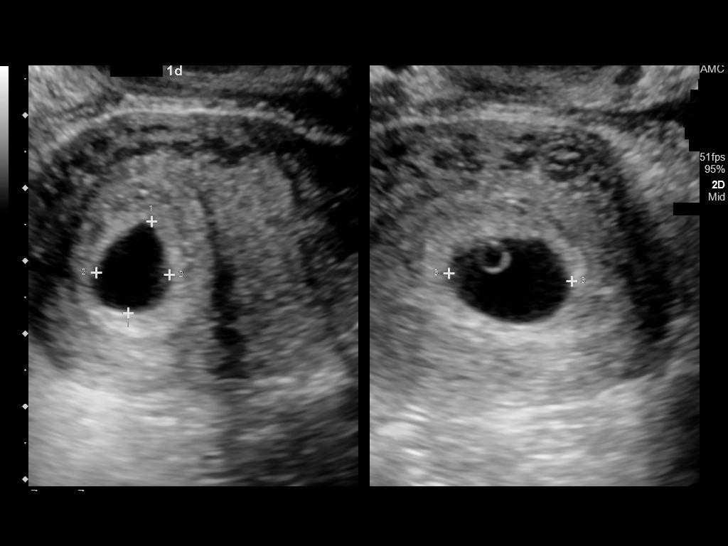
[im 20/42]
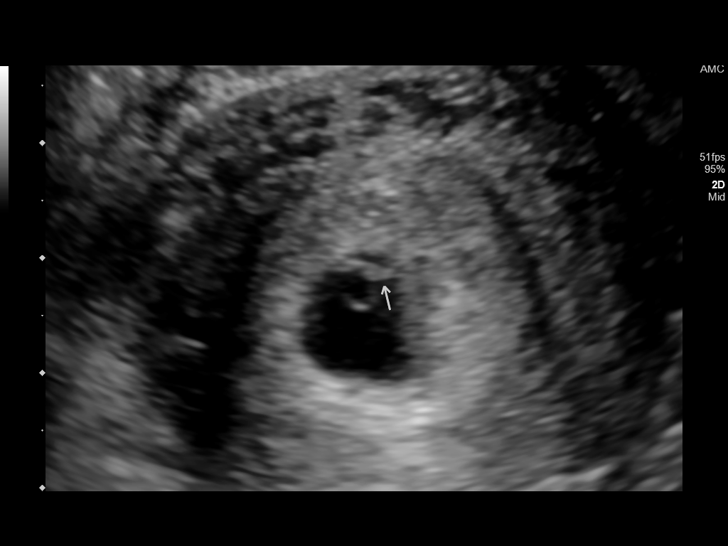
[im 23/42]
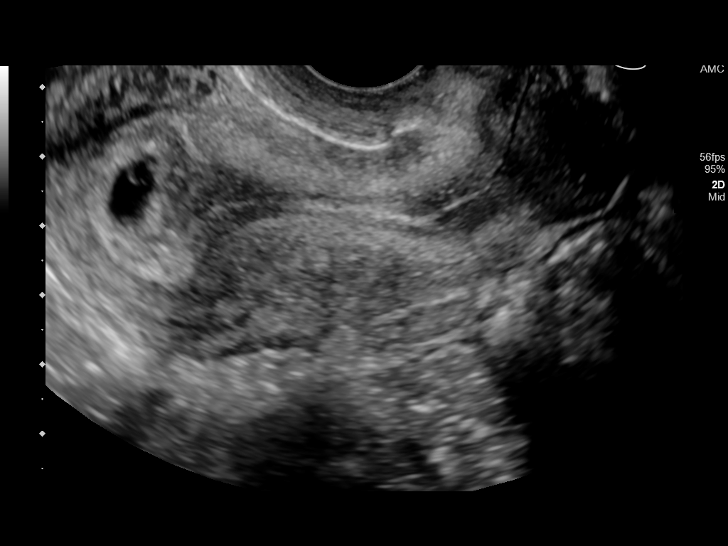
[im 26/42]
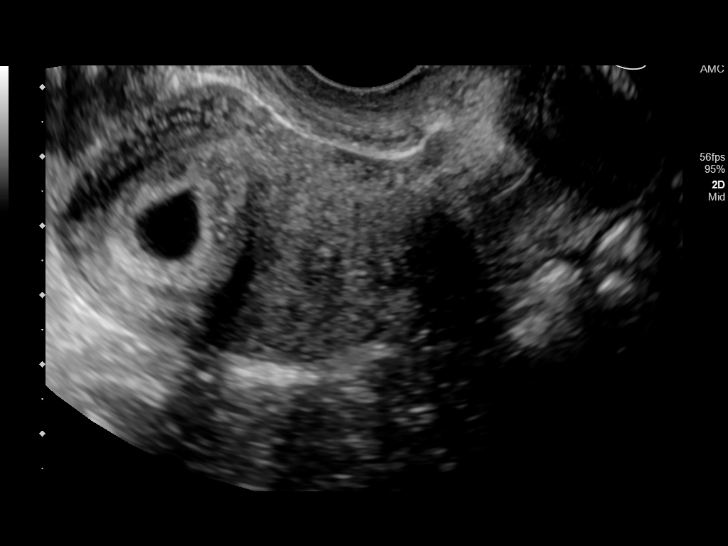
[im 29/42]
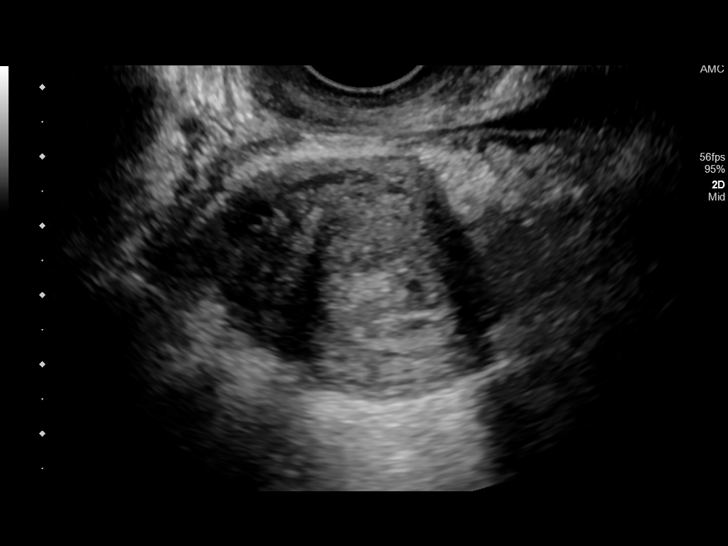
[im 32/42]
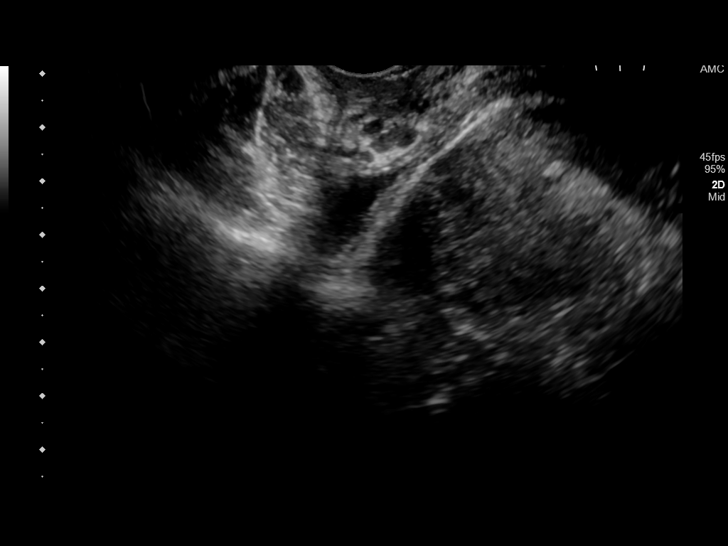
[im 35/42]
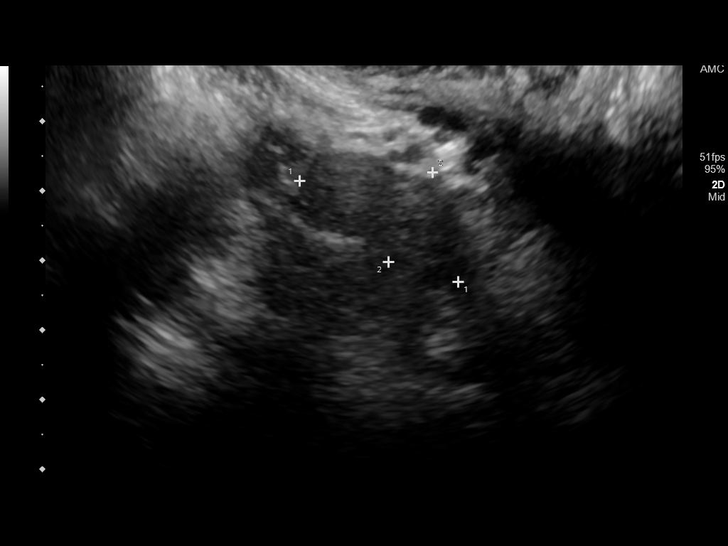
[im 38/42]
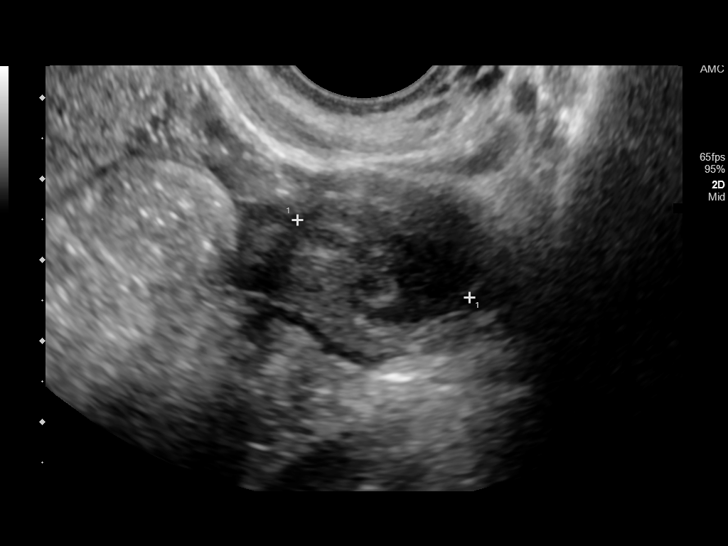
[im 42/42]
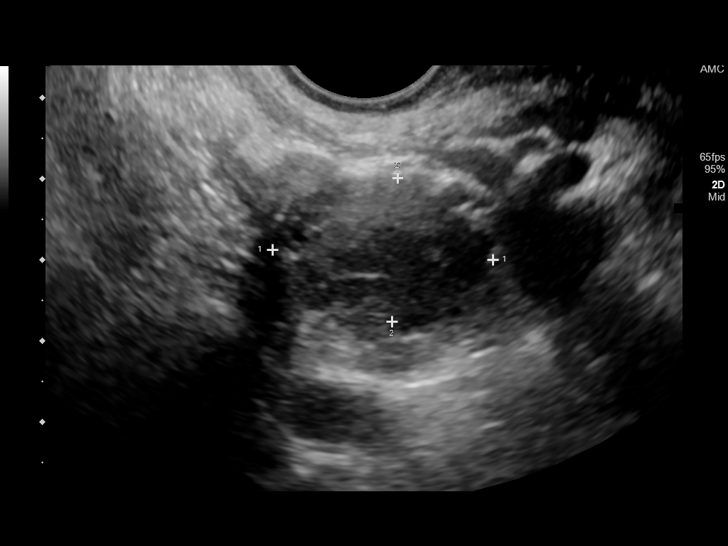

[14 of 28 positions shown; findings below may reference images not displayed]

FINDINGS: Intrauterine gestational sac: Single

Yolk sac:  Visible

Embryo:  Visible

Cardiac Activity: Detected

Heart Rate: 101 bpm

CRL:  3.3 mm   6 w   0 d                  US EDC: 03/17/2019

Subchorionic hemorrhage:  None visualized.

Maternal uterus/adnexae: Left ovary corpus luteum cyst suspected
(image 17). The left ovary is 2.7 x 1.8 by 2.3 centimeters. The
right ovary is smaller and normal measuring 2.0 x 2.7 x
centimeters. No pelvic free fluid identified.
IMPRESSION: Single living IUP demonstrated. No acute maternal findings
visualized.

## 2021-01-07 NOTE — L&D Delivery Note (Signed)
Delivery Note  [name] is a Y1P5093 at [redacted]w[redacted]d, Patient's last menstrual period was 02/17/2021 (exact date)., consistent with [place] at [redacted]w[redacted]d.   First Stage: Labor onset: 0600 Augmentation: AROM Analgesia /Anesthesia intrapartum: None AROM at 1330 GBS: positive IP Antibiotics: penicillin x 3 doses   Second Stage: Complete dilation at 1935 Onset of pushing at 1937 FHR second stage 135 bpm with moderate variability  [name] presented to L&D in early labor. She was expectantly managed.  Contractions became less frequent. She was augmented with AROM and contractions became progressively worse . She progressed well to C/C/+2 with a strong urge to push.  She pushed effectively over approximately 30 minutes for a spontaneous vaginal birth.  Delivery of a viable baby girl on 11/11/2021 at 2007 by CNM in hands and knees.   Delivery of fetal head in OA position with restitution to LOT. Loose nuchal cord x 1, easily reduced;  Anterior then posterior shoulders delivered easily with gentle downward traction. Baby passed under to mom and assisted to skin to skin on maternal abdomen.  Mom and baby supported while mom repositioned to semi-fowlers.  Baby attended to by baby RN Cord double clamped after cessation of pulsation, cut by grandmother of baby.  Cord blood sample collection: Yes O POS  Third Stage: Oxytocin bolus started after delivery of infant for hemorrhage prophylaxis  Placenta delivered intact with 3 VC @ 2016 Placenta disposition: discarded  Uterine tone firm / bleeding moderate  No laceration identified  Anesthesia for repair: N/A Repair not indicated  Est. Blood Loss (mL): 267 ml  Complications: None  Mom to postpartum.  Baby to Couplet care / Skin to Skin.  Newborn: Information for the patient's newborn:  Garth Schlatter, Girl [name] [[id number]]  Live born female [name]  Birth Weight:  pending  APGAR: 8, 9   Newborn Delivery   Birth date/time: 11/11/2021  20:07:00 Delivery type: Vaginal, Spontaneous       Feeding planned: breast feeding  ---------- Drinda Butts, CNM Certified Nurse Midwife [facility]

## 2021-01-26 ENCOUNTER — Other Ambulatory Visit: Payer: Self-pay

## 2021-01-26 DIAGNOSIS — O200 Threatened abortion: Secondary | ICD-10-CM

## 2021-01-31 ENCOUNTER — Ambulatory Visit
Admission: RE | Admit: 2021-01-31 | Discharge: 2021-01-31 | Disposition: A | Discharge status: Home / Self Care | Payer: Medicaid Other | Source: Ambulatory Visit

## 2021-01-31 DIAGNOSIS — O200 Threatened abortion: Secondary | ICD-10-CM | POA: Diagnosis present

## 2021-03-26 DIAGNOSIS — A568 Sexually transmitted chlamydial infection of other sites: Secondary | ICD-10-CM | POA: Insufficient documentation

## 2021-04-06 DIAGNOSIS — Z3483 Encounter for supervision of other normal pregnancy, third trimester: Secondary | ICD-10-CM | POA: Insufficient documentation

## 2021-05-01 LAB — OB RESULTS CONSOLE HEPATITIS B SURFACE ANTIGEN: Hepatitis B Surface Ag: NEGATIVE

## 2021-05-01 LAB — OB RESULTS CONSOLE VARICELLA ZOSTER ANTIBODY, IGG: Varicella: IMMUNE

## 2021-05-01 LAB — OB RESULTS CONSOLE RPR: RPR: NONREACTIVE

## 2021-05-01 LAB — OB RESULTS CONSOLE RUBELLA ANTIBODY, IGM: Rubella: IMMUNE

## 2021-05-22 DIAGNOSIS — A6000 Herpesviral infection of urogenital system, unspecified: Secondary | ICD-10-CM | POA: Insufficient documentation

## 2021-05-22 DIAGNOSIS — O09891 Supervision of other high risk pregnancies, first trimester: Secondary | ICD-10-CM | POA: Insufficient documentation

## 2021-10-15 ENCOUNTER — Encounter: Payer: Self-pay | Admitting: Obstetrics and Gynecology

## 2021-10-15 ENCOUNTER — Observation Stay
Admission: EM | Admit: 2021-10-15 | Discharge: 2021-10-15 | Disposition: A | Discharge status: Home / Self Care | Payer: Medicaid Other | Attending: Obstetrics and Gynecology | Admitting: Obstetrics and Gynecology

## 2021-10-15 ENCOUNTER — Other Ambulatory Visit: Payer: Self-pay

## 2021-10-15 DIAGNOSIS — O99891 Other specified diseases and conditions complicating pregnancy: Secondary | ICD-10-CM | POA: Diagnosis not present

## 2021-10-15 DIAGNOSIS — O4703 False labor before 37 completed weeks of gestation, third trimester: Principal | ICD-10-CM | POA: Insufficient documentation

## 2021-10-15 DIAGNOSIS — Z3A34 34 weeks gestation of pregnancy: Secondary | ICD-10-CM | POA: Diagnosis not present

## 2021-10-15 DIAGNOSIS — R82998 Other abnormal findings in urine: Secondary | ICD-10-CM | POA: Diagnosis not present

## 2021-10-15 DIAGNOSIS — O0993 Supervision of high risk pregnancy, unspecified, third trimester: Secondary | ICD-10-CM | POA: Insufficient documentation

## 2021-10-15 LAB — CHLAMYDIA/NGC RT PCR (ARMC ONLY)
Chlamydia Tr: NOT DETECTED
N gonorrhoeae: NOT DETECTED

## 2021-10-15 LAB — URINALYSIS, COMPLETE (UACMP) WITH MICROSCOPIC
Bilirubin Urine: NEGATIVE
Glucose, UA: 50 mg/dL — AB
Hgb urine dipstick: NEGATIVE
Ketones, ur: NEGATIVE mg/dL
Leukocytes,Ua: NEGATIVE
Nitrite: NEGATIVE
Protein, ur: NEGATIVE mg/dL
Specific Gravity, Urine: 1.009 (ref 1.005–1.030)
pH: 7 (ref 5.0–8.0)

## 2021-10-15 LAB — WET PREP, GENITAL
Clue Cells Wet Prep HPF POC: NONE SEEN
Sperm: NONE SEEN
Trich, Wet Prep: NONE SEEN
WBC, Wet Prep HPF POC: >=10 — AB (ref <10)

## 2021-10-15 LAB — GROUP B STREP BY PCR: Group B strep by PCR: POSITIVE — AB

## 2021-10-15 MED ORDER — NIFEDIPINE 10 MG PO CAPS
ORAL_CAPSULE | ORAL | Status: AC
  Filled 2021-10-15: qty 2

## 2021-10-15 MED ORDER — NIFEDIPINE 10 MG PO CAPS
20.0000 mg | ORAL_CAPSULE | Freq: Once | ORAL | Status: AC
  Administered 2021-10-15: 20 mg via ORAL

## 2021-10-15 MED ORDER — VALACYCLOVIR HCL 500 MG PO TABS
500.0000 mg | ORAL_TABLET | Freq: Two times a day (BID) | ORAL | Status: DC
  Filled 2021-10-15: qty 1

## 2021-10-15 MED ORDER — TERCONAZOLE 0.4 % VA CREA: 1.0000 | TOPICAL_CREAM | Freq: Every day | VAGINAL | 0 refills | Status: DC

## 2021-10-15 NOTE — Discharge Summary (Signed)
[name] is a 21 y.o. female. She is at [redacted]w[redacted]d gestation. No LMP recorded. Patient is pregnant. Estimated Date of Delivery: 11/24/21  Prenatal care site: 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  Chlamydia treated 1st tri, neg TOC 04/2021 Hx SVD at 36.6wks last preg Resolved [facility] S/p fall at 28wks, not seen or monitored, no abd pain or VB  Chief complaint:having occasional UCs for the last week. Usually a few times per day.  Denies VB or LOF, some discharge noted. Active FM>     S: Resting comfortably. no CTX, no VB.no LOF,  Active fetal movement. Denies: HA, visual changes, SOB, or RUQ/epigastric pain  Maternal Medical History:   Past Medical History:  Diagnosis Date   Medical history non-contributory     Past Surgical History:  Procedure Laterality Date   NO PAST SURGERIES      No Known Allergies  Prior to Admission medications   Medication Sig Start Date End Date Taking? Authorizing Provider  Prenatal Vit-Fe Fumarate-FA (MULTIVITAMIN-PRENATAL) 27-0.8 MG TABS tablet Take 1 tablet by mouth daily at 12 noon.   Yes [provider]  acetaminophen (TYLENOL) 500 MG tablet Take 2 tablets (1,000 mg total) by mouth every 6 (six) hours as needed for mild pain or moderate pain. Patient not taking: Reported on 10/15/2021 02/24/19   Genia Del, CNM      Social History: She  reports that she has never smoked. She has never used smokeless tobacco. She reports that she does not currently use alcohol. She reports that she does not currently use drugs.  Family History:  no history of gyn cancers  Review of Systems: A full review of systems was performed and negative except as noted in the HPI.     O:  BP 118/66 (BP Location: Left Arm)   Pulse (!) 126   Temp 98.3 F (36.8 C) (Oral)   Resp 18   Ht 5' (1.524 m)   Wt 65.8 kg   BMI 28.32 kg/m  Results for orders placed or performed during the  hospital encounter of 10/15/21 (from the past 48 hour(s))  Wet prep, genital   Collection Time: 10/15/21  4:00 PM   Specimen: Urine, Clean Catch  Result Value Ref Range   Yeast Wet Prep HPF POC PRESENT (A) NONE SEEN   Trich, Wet Prep NONE SEEN NONE SEEN   Clue Cells Wet Prep HPF POC NONE SEEN NONE SEEN   WBC, Wet Prep HPF POC >=10 (A) <10   Sperm NONE SEEN   Urinalysis, Complete w Microscopic Urine, Clean Catch   Collection Time: 10/15/21  4:00 PM  Result Value Ref Range   Color, Urine YELLOW (A) YELLOW   APPearance HAZY (A) CLEAR   Specific Gravity, Urine 1.009 1.005 - 1.030   pH 7.0 5.0 - 8.0   Glucose, UA 50 (A) NEGATIVE mg/dL   Hgb urine dipstick NEGATIVE NEGATIVE   Bilirubin Urine NEGATIVE NEGATIVE   Ketones, ur NEGATIVE NEGATIVE mg/dL   Protein, ur NEGATIVE NEGATIVE mg/dL   Nitrite NEGATIVE NEGATIVE   Leukocytes,Ua NEGATIVE NEGATIVE   RBC / HPF 0-5 0 - 5 RBC/hpf   WBC, UA 0-5 0 - 5 WBC/hpf   Bacteria, UA MANY (A) NONE SEEN   Squamous Epithelial / LPF 0-5 0 - 5   Amorphous Crystal PRESENT      Constitutional: NAD, AAOx3  HE/ENT: extraocular movements grossly intact, moist mucous membranes CV: RRR PULM:  nl respiratory effort, CTABL     Abd: gravid, non-tender, non-distended, soft      Ext: Non-tender, Nonedematous   Psych: mood appropriate, speech normal Pelvic: SSE done: mild erythema, mod amount yellow/white DC.  SVE: FTP/50/-3, soft/posterior.   Fetal  monitoring: Cat I Appropriate for GA Baseline: 125 Variability: moderate Accelerations:present x >2 Decelerations absent  TOCO: UCs stopped, mild UI   A/P: 21 y.o. [redacted]w[redacted]d here for antenatal surveillance for preterm contractions  Principle Diagnosis:  High risk pregnancy in third trimester  Preterm labor: not present.  Fetal Wellbeing: Reassuring Cat 1 tracing with Reactive NST  Wet prep + yeast, Rx terazol to pharmacy.  UA with many bacteria, neg nitrites, urine culture added on.  D/c home stable,  precautions reviewed, follow-up as scheduled.    Francetta Found, CNM 10/15/2021  5:27 PM

## 2021-10-15 NOTE — OB Triage Note (Signed)
Pt discharged home per order.  Pt stable and ambulatory and an After Visit Summary was printed and given to the patient. Discharge education completed with patient/family including follow up instructions, appointments, and medication list. Pt received labor and bleeding precautions. Patient able to verbalize understanding, all questions fully answered upon discharge. Patient instructed to return to ED, call 911, or call MD for any changes in condition.  G/C, urine culture pending.

## 2021-10-15 NOTE — Progress Notes (Addendum)
Pt presents to L/D triage with reported intermittent abdominal tightening/pain that began this past week. She rates the pain 4/10, and describes it as occurring 2x/day.  Pt reports no bleeding or LOF and positive fetal movement. Pt reports no urinary or vaginal symptoms.  Monitors applied and assessing- initial FHT 150. VSS.  Pt PO hydrating.

## 2021-10-17 LAB — URINE CULTURE: Culture: <10000 — AB

## 2021-10-21 ENCOUNTER — Observation Stay
Admission: EM | Admit: 2021-10-21 | Discharge: 2021-10-21 | Disposition: A | Discharge status: Home / Self Care | Payer: Medicaid Other | Attending: Obstetrics and Gynecology | Admitting: Obstetrics and Gynecology

## 2021-10-21 ENCOUNTER — Other Ambulatory Visit: Payer: Self-pay

## 2021-10-21 ENCOUNTER — Encounter: Payer: Self-pay | Admitting: Obstetrics and Gynecology

## 2021-10-21 DIAGNOSIS — O0993 Supervision of high risk pregnancy, unspecified, third trimester: Secondary | ICD-10-CM | POA: Diagnosis not present

## 2021-10-21 DIAGNOSIS — R1011 Right upper quadrant pain: Secondary | ICD-10-CM | POA: Insufficient documentation

## 2021-10-21 DIAGNOSIS — Z79899 Other long term (current) drug therapy: Secondary | ICD-10-CM | POA: Diagnosis not present

## 2021-10-21 DIAGNOSIS — Z3A35 35 weeks gestation of pregnancy: Secondary | ICD-10-CM | POA: Insufficient documentation

## 2021-10-21 DIAGNOSIS — O26893 Other specified pregnancy related conditions, third trimester: Principal | ICD-10-CM | POA: Diagnosis present

## 2021-10-21 DIAGNOSIS — O4703 False labor before 37 completed weeks of gestation, third trimester: Secondary | ICD-10-CM | POA: Diagnosis not present

## 2021-10-21 LAB — CBC
HCT: 36.6 % (ref 36.0–46.0)
Hemoglobin: 12.1 g/dL (ref 12.0–15.0)
MCH: 29.4 pg (ref 26.0–34.0)
MCHC: 33.1 g/dL (ref 30.0–36.0)
MCV: 89.1 fL (ref 80.0–100.0)
Platelets: 163 10*3/uL (ref 150–400)
RBC: 4.11 MIL/uL (ref 3.87–5.11)
RDW: 12.1 % (ref 11.5–15.5)
WBC: 8 10*3/uL (ref 4.0–10.5)
nRBC: 0 % (ref 0.0–0.2)

## 2021-10-21 LAB — COMPREHENSIVE METABOLIC PANEL
ALT: 14 U/L (ref 0–44)
AST: 25 U/L (ref 15–41)
Albumin: 3.1 g/dL — ABNORMAL LOW (ref 3.5–5.0)
Alkaline Phosphatase: 130 U/L — ABNORMAL HIGH (ref 38–126)
Anion gap: 6 (ref 5–15)
BUN: 7 mg/dL (ref 6–20)
CO2: 23 mmol/L (ref 22–32)
Calcium: 9.1 mg/dL (ref 8.9–10.3)
Chloride: 110 mmol/L (ref 98–111)
Creatinine, Ser: 0.61 mg/dL (ref 0.44–1.00)
GFR, Estimated: >60 mL/min (ref >60)
Glucose, Bld: 136 mg/dL — ABNORMAL HIGH (ref 70–99)
Potassium: 3.5 mmol/L (ref 3.5–5.1)
Sodium: 139 mmol/L (ref 135–145)
Total Bilirubin: 0.4 mg/dL (ref 0.3–1.2)
Total Protein: 6.7 g/dL (ref 6.5–8.1)

## 2021-10-21 LAB — PROTEIN / CREATININE RATIO, URINE
Creatinine, Urine: 49 mg/dL
Protein Creatinine Ratio: 0.14 mg/mg{Cre} (ref 0.00–0.15)
Total Protein, Urine: 7 mg/dL

## 2021-10-21 MED ORDER — PANTOPRAZOLE SODIUM 40 MG PO TBEC: 40.0000 mg | DELAYED_RELEASE_TABLET | Freq: Every day | ORAL | 0 refills | Status: AC

## 2021-10-21 MED ORDER — ACETAMINOPHEN 500 MG PO TABS
1000.0000 mg | ORAL_TABLET | Freq: Once | ORAL | Status: AC
  Administered 2021-10-21: 1000 mg via ORAL
  Filled 2021-10-21: qty 2

## 2021-10-21 MED ORDER — PANTOPRAZOLE SODIUM 40 MG PO TBEC
40.0000 mg | DELAYED_RELEASE_TABLET | Freq: Once | ORAL | Status: AC
  Administered 2021-10-21: 40 mg via ORAL
  Filled 2021-10-21: qty 1

## 2021-10-21 NOTE — Discharge Summary (Signed)
[name] is a 21 y.o. female. She is at [redacted]w[redacted]d gestation. No LMP recorded. Patient is pregnant. Estimated Date of Delivery: 11/24/21  Prenatal care site: [facility]   Current pregnancy complicated by:  Hx HSV1, discussed suppression, ordered 10/15/21 Chlamydia treated 1st tri, neg TOC 04/2021 Hx SVD at 36.6wks last preg Resolved [facility] S/p fall at 28wks, not seen or monitored, no abd pain or VB  Chief complaint: new onset RUQ pain today, reports no nausea, diarrhea or vomiting. Feels like activity worsens pain, but rest does not improve pain. Reports good FM now, but slightly less than usual. Has eaten well today, reports pain is 10/10.     S: Resting comfortably. no CTX, no VB.no LOF,  Active fetal movement. Denies: HA, visual changes, SOB, or RUQ/epigastric pain  Maternal Medical History:   Past Medical History:  Diagnosis Date   Medical history non-contributory     Past Surgical History:  Procedure Laterality Date   NO PAST SURGERIES      No Known Allergies  Prior to Admission medications   Medication Sig Start Date End Date Taking? Authorizing Provider  Prenatal Vit-Fe Fumarate-FA (MULTIVITAMIN-PRENATAL) 27-0.8 MG TABS tablet Take 1 tablet by mouth daily at 12 noon.   Yes [provider]  acetaminophen (TYLENOL) 500 MG tablet Take 2 tablets (1,000 mg total) by mouth every 6 (six) hours as needed for mild pain or moderate pain. Patient not taking: Reported on 10/15/2021 02/24/19   Lisette Grinder, CNM      Social History: She  reports that she has never smoked. She has never used smokeless tobacco. She reports that she does not currently use alcohol. She reports that she does not currently use drugs.  Family History:  no history of gyn cancers  Review of Systems: A full review of systems was performed and negative except as noted in the HPI.     O:  BP 116/62 (BP Location: Right Arm)   Pulse (!) 103   Temp 98.2 F (36.8 C) (Oral)    Ht 5' (1.524 m)   Wt 63.5 kg   BMI 27.34 kg/m   Vitals:   10/21/21 1950 10/21/21 2120  BP: 128/74 116/62    Results for orders placed or performed during the hospital encounter of 10/21/21 (from the past 48 hour(s))  Comprehensive metabolic panel   Collection Time: 10/21/21  8:07 PM  Result Value Ref Range   Sodium 139 135 - 145 mmol/L   Potassium 3.5 3.5 - 5.1 mmol/L   Chloride 110 98 - 111 mmol/L   CO2 23 22 - 32 mmol/L   Glucose, Bld 136 (H) 70 - 99 mg/dL   BUN 7 6 - 20 mg/dL   Creatinine, Ser 0.61 0.44 - 1.00 mg/dL   Calcium 9.1 8.9 - 10.3 mg/dL   Total Protein 6.7 6.5 - 8.1 g/dL   Albumin 3.1 (L) 3.5 - 5.0 g/dL   AST 25 15 - 41 U/L   ALT 14 0 - 44 U/L   Alkaline Phosphatase 130 (H) 38 - 126 U/L   Total Bilirubin 0.4 0.3 - 1.2 mg/dL   GFR, Estimated >60 >60 mL/min   Anion gap 6 5 - 15  CBC   Collection Time: 10/21/21  8:07 PM  Result Value Ref Range   WBC 8.0 4.0 - 10.5 K/uL   RBC 4.11 3.87 - 5.11 MIL/uL   Hemoglobin 12.1 12.0 - 15.0 g/dL   HCT 36.6 36.0 - 46.0 %  MCV 89.1 80.0 - 100.0 fL   MCH 29.4 26.0 - 34.0 pg   MCHC 33.1 30.0 - 36.0 g/dL   RDW 37.8 58.8 - 50.2 %   Platelets 163 150 - 400 K/uL   nRBC 0.0 0.0 - 0.2 %     Constitutional: NAD, AAOx3  HE/ENT: extraocular movements grossly intact, moist mucous membranes CV: RRR PULM: nl respiratory effort, CTABL     Abd: gravid, non-tender, non-distended, soft      Ext: Non-tender, Nonedematous   Psych: mood appropriate, speech normal Pelvic: deferred  Fetal  monitoring: Cat I Appropriate for GA Baseline: 125bpm Variability: moderate Accelerations:present x >2 Decelerations absent  TOCO: UCs occasional with UI   A/P: 21 y.o. [redacted]w[redacted]d here for antenatal surveillance for RUQ pain  Principle Diagnosis:  High risk pregnancy in third trimester  Preterm labor: not present.  Fetal Wellbeing: Reassuring Cat 1 tracing with Reactive NST  CMP and CBC wnl RUQ [place] ordered to be done outpatient.  Given tylenol  and protonix with some relief. Discussed bland diet, avoidance of fatty or spicy meals.  D/c home stable, precautions reviewed, follow-up as scheduled.    Randa Ngo, CNM 10/21/2021  9:26 PM

## 2021-10-21 NOTE — OB Triage Note (Addendum)
Patient is a [id number] at 35wks1d, coming in for right sided abdominal pain that radiates to her hip. Pain increases with exertion, but does not go away with rest. Pain is 10/10 and is only relieved by putting pressure on area. Patient reports +FM, but less than normal. Patient denies leaking of fluid or bright red blood. Initial FHT 145bpm.

## 2021-10-24 DIAGNOSIS — O321XX0 Maternal care for breech presentation, not applicable or unspecified: Secondary | ICD-10-CM | POA: Insufficient documentation

## 2021-11-05 ENCOUNTER — Encounter: Payer: Self-pay | Admitting: Obstetrics & Gynecology

## 2021-11-05 ENCOUNTER — Observation Stay
Admission: EM | Admit: 2021-11-05 | Discharge: 2021-11-05 | Disposition: A | Discharge status: Home / Self Care | Payer: Medicaid Other | Attending: Obstetrics and Gynecology | Admitting: Obstetrics and Gynecology

## 2021-11-05 ENCOUNTER — Other Ambulatory Visit: Payer: Self-pay

## 2021-11-05 DIAGNOSIS — Z3A37 37 weeks gestation of pregnancy: Secondary | ICD-10-CM | POA: Diagnosis not present

## 2021-11-05 DIAGNOSIS — O479 False labor, unspecified: Secondary | ICD-10-CM | POA: Diagnosis present

## 2021-11-05 DIAGNOSIS — O471 False labor at or after 37 completed weeks of gestation: Principal | ICD-10-CM | POA: Insufficient documentation

## 2021-11-05 MED ORDER — CALCIUM CARBONATE ANTACID 500 MG PO CHEW: 2.0000 | CHEWABLE_TABLET | ORAL | Status: DC | PRN

## 2021-11-05 MED ORDER — ACETAMINOPHEN 500 MG PO TABS: 1000.0000 mg | ORAL_TABLET | Freq: Four times a day (QID) | ORAL | Status: DC | PRN

## 2021-11-05 NOTE — Discharge Summary (Signed)
Lysha Nelma Rothman is a 21 y.o. female. She is at [redacted]w[redacted]d gestation. No LMP recorded. Patient is pregnant. 11/24/2021, by Patient Reported   Prenatal care site: [facility]  Chief complaint: contractions   HPI: Zohal presents to L&D with complaints of contractions that started earlier today.  Feels like they are getting more consistent but not necessarily stronger.  She reports a quick and easy labor last pregnancy and unsure if this is labor or not. Denies vaginal bleeding, LOF.  Endorses good fetal movement.  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   Maternal Medical History:  Past Medical Hx:  has a past medical history of Medical history non-contributory.    Past Surgical Hx:  has a past surgical history that includes No past surgeries.   No Known Allergies   Prior to Admission medications   Medication Sig Start Date End Date Taking? Authorizing Provider  Prenatal Vit-Fe Fumarate-FA (MULTIVITAMIN-PRENATAL) 27-0.8 MG TABS tablet Take 1 tablet by mouth daily at 12 noon.   Yes [provider]  acetaminophen (TYLENOL) 500 MG tablet Take 2 tablets (1,000 mg total) by mouth every 6 (six) hours as needed for mild pain or moderate pain. Patient not taking: Reported on 10/15/2021 02/24/19   Genia Del, CNM  pantoprazole (PROTONIX) 40 MG tablet Take 1 tablet (40 mg total) by mouth daily. 10/21/21 10/21/22  McVey, Prudencio Pair, CNM    Social History: She  reports that she has never smoked. She has never used smokeless tobacco. She reports that she does not currently use alcohol. She reports that she does not currently use drugs.  Family History: family history reviewed, no history of gyn cancers   Review of Systems: A full review of  systems was performed and negative except as noted in the HPI.     Pertinent Results:  Prenatal Labs: Blood type/Rh O POS   Antibody screen Negative    Rubella Immune    Varicella Immune  RPR NR    HBsAg NR   Hep C NR   HIV NR    GC neg  Chlamydia neg  Genetic screening cfDNA negative   1 hour GTT 141  3 hour GTT 75, 127, 108, 103  GBS POSITIVE/-- (10/09 1600)     O:  BP 113/72 (BP Location: Right Arm)   Pulse (!) 106   Temp 98.2 F (36.8 C) (Oral)   Resp 16   Ht 5' (1.524 m)   Wt 68.9 kg   BMI 29.69 kg/m  No results found for this or any previous visit (from the past 48 hour(s)).   Constitutional: NAD, AAOx3  HE/ENT: extraocular movements grossly intact, moist mucous membranes CV: RRR PULM: nl respiratory effort Abd: gravid, non-tender, non-distended, soft  Ext: Non-tender, Nonedmeatous Psych: mood appropriate, speech normal SVE: Dilation: 3 Effacement (%): 60 Cervical Position: Middle Station: -2 Presentation: Vertex Exam by:: Brandace Cargle CNM  -No change in > 2 hours    NST: Baseline FHR: 130 beats/min Variability: moderate Accelerations: present Decelerations: absent Tocometry: 6-8 minutes, mild to palpation Time: at least 20 minutes   Interpretation: Category I INDICATIONS: Uterine contractions in pregnancy  RESULTS:  A NST procedure was performed with FHR monitoring and a normal baseline established, appropriate time of 20-40 minutes  of evaluation, and accels >2 seen w 15x15 characteristics.  Results show a REACTIVE NST.   Assessment: 21 y.o. [id number] [redacted]w[redacted]d 11/24/2021, by Patient Reported   Principle diagnosis: Uterine contractions during pregnancy [O47.9]   Plan: 1) Reactive NST  -Category 1 tracing  -Reassuring fetal status   2) Labor: Not present  -Offered to stay longer for labor eval or discharge home with strict labor precautions.  Onya would prefer to discharge home  -Discussed warning signs to return to L&D triage with  -Reviewed  comfort measures   3) Disposition: discharge home stable -Precautions reviewed  -Follow up as scheduled this Friday for routine prenatal visit    ----- Drinda Butts, CNM Certified Nurse Midwife [facility]

## 2021-11-05 NOTE — OB Triage Note (Signed)
Patient is a 21 yo, G3P1, at 37 weeks 2 days. Patient presents with complaints of irregular contractions.  Patient denies any vaginal bleeding or LOF. Patient reports +FM. Patient states she has a history of PTL/PTD. Monitors applied and assessing. VSS. Initial fetal heart tone 130. SVE 3/50/-2. Mackie,CNM notified of patients arrival to unit. Plan to place in observation for labor eval.

## 2021-11-08 ENCOUNTER — Encounter: Payer: Self-pay | Admitting: Obstetrics and Gynecology

## 2021-11-08 ENCOUNTER — Observation Stay
Admission: EM | Admit: 2021-11-08 | Discharge: 2021-11-09 | Disposition: A | Discharge status: Home / Self Care | Payer: Medicaid Other | Attending: Obstetrics and Gynecology | Admitting: Obstetrics and Gynecology

## 2021-11-08 ENCOUNTER — Other Ambulatory Visit: Payer: Self-pay

## 2021-11-08 DIAGNOSIS — Z3A37 37 weeks gestation of pregnancy: Secondary | ICD-10-CM | POA: Diagnosis not present

## 2021-11-08 DIAGNOSIS — Z79899 Other long term (current) drug therapy: Secondary | ICD-10-CM | POA: Insufficient documentation

## 2021-11-08 DIAGNOSIS — O471 False labor at or after 37 completed weeks of gestation: Secondary | ICD-10-CM | POA: Diagnosis present

## 2021-11-08 NOTE — OB Triage Note (Signed)
Pt is a G3 P1 with c/o of ctx's x 2 hours. McVey, CNM at bedside.

## 2021-11-09 ENCOUNTER — Encounter: Payer: Self-pay | Admitting: Obstetrics and Gynecology

## 2021-11-09 NOTE — Discharge Summary (Signed)
[name] is a 21 y.o. female. She is at [redacted]w[redacted]d gestation. No LMP recorded. Patient is pregnant. 11/24/2021, by Patient Reported   Prenatal care site: [facility]  Chief complaint: contractions   HPI: [name] presents to L&D with complaints of contractions that started earlier about 2hrs prior to arrival, reports not timing the UCs, but feels they were lasting about 30 sec each. She is concerned due to GBS Positive status. Denies VB or LOF.   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   Maternal Medical History:  Past Medical Hx:  has a past medical history of Medical history non-contributory.    Past Surgical Hx:  has a past surgical history that includes No past surgeries.   No Known Allergies   Prior to Admission medications   Medication Sig Start Date End Date Taking? Authorizing Provider  Prenatal Vit-Fe Fumarate-FA (MULTIVITAMIN-PRENATAL) 27-0.8 MG TABS tablet Take 1 tablet by mouth daily at 12 noon.   Yes [provider]  acetaminophen (TYLENOL) 500 MG tablet Take 2 tablets (1,000 mg total) by mouth every 6 (six) hours as needed for mild pain or moderate pain. Patient not taking: Reported on 10/15/2021 02/24/19   Lisette Grinder, CNM  pantoprazole (PROTONIX) 40 MG tablet Take 1 tablet (40 mg total) by mouth daily. 10/21/21 10/21/22  Nalda Shackleford, Murray Hodgkins, CNM    Social History: She  reports that she has never smoked. She has never used smokeless tobacco. She reports that she does not currently use alcohol. She reports that she does not currently use drugs.  Family History: family history reviewed, no history of gyn cancers   Review of Systems: A full review of systems was performed and negative except as noted in the HPI.      Pertinent Results:  Prenatal Labs: Blood type/Rh O POS   Antibody screen Negative    Rubella Immune    Varicella Immune  RPR NR    HBsAg NR   Hep C NR   HIV NR    GC neg  Chlamydia neg  Genetic screening cfDNA negative   1 hour GTT 141  3 hour GTT 75, 127, 108, 103  GBS POSITIVE/-- (10/09 1600)     O:  BP 116/77 (BP Location: Left Arm)   Temp 97.9 F (36.6 C) (Oral)   Resp 18   Ht 5' (1.524 m)   Wt 68.9 kg   BMI 29.69 kg/m  No results found for this or any previous visit (from the past 48 hour(s)).   Constitutional: NAD, AAOx3  HE/ENT: extraocular movements grossly intact, moist mucous membranes CV: RRR PULM: nl respiratory effort Abd: gravid, non-tender, non-distended, soft  Ext: Non-tender, Nonedmeatous Psych: mood appropriate, speech normal  SVE: Dilation: 3 Effacement (%): 70 Cervical Position: Posterior Station: -2 Presentation: Vertex Exam by:: Jaideep Pollack, CNM     NST: Baseline FHR: 120 beats/min Variability: moderate Accelerations: present Decelerations: absent  Tocometry: 4-12 minutes, mild to palpation   Interpretation: Category I  RESULTS:  A NST procedure was performed with FHR monitoring and a normal baseline established, appropriate time of 20-40 minutes of evaluation, and accels >2 seen w 15x15 characteristics.  Results show a REACTIVE NST.   Assessment: 21 y.o. [id number] [redacted]w[redacted]d 11/24/2021  Principle diagnosis: Labor  and delivery, indication for care [O75.9]   Plan: 1) Reactive NST  -Category 1 tracing  -Reassuring fetal status   2) Labor: Not present  -Offered to stay longer for labor eval or discharge home with strict labor precautions.  [name] would prefer to discharge home  -Discussed warning signs to return to L&D triage with  -Reviewed comfort measures   3) Disposition: discharge home stable -Precautions reviewed  -Follow up as scheduled for routine prenatal visit    ----- [name], CNM Certified Nurse  Midwife [facility]

## 2021-11-11 ENCOUNTER — Encounter: Payer: Self-pay | Admitting: Obstetrics and Gynecology

## 2021-11-11 ENCOUNTER — Other Ambulatory Visit: Payer: Self-pay

## 2021-11-11 ENCOUNTER — Inpatient Hospital Stay
Admission: EM | Admit: 2021-11-11 | Discharge: 2021-11-13 | DRG: 806 | Disposition: A | Discharge status: Home / Self Care | Payer: Medicaid Other

## 2021-11-11 DIAGNOSIS — O9832 Other infections with a predominantly sexual mode of transmission complicating childbirth: Secondary | ICD-10-CM | POA: Diagnosis present

## 2021-11-11 DIAGNOSIS — O99824 Streptococcus B carrier state complicating childbirth: Secondary | ICD-10-CM | POA: Diagnosis present

## 2021-11-11 DIAGNOSIS — A6000 Herpesviral infection of urogenital system, unspecified: Secondary | ICD-10-CM | POA: Diagnosis present

## 2021-11-11 DIAGNOSIS — Z3A38 38 weeks gestation of pregnancy: Secondary | ICD-10-CM

## 2021-11-11 DIAGNOSIS — O26893 Other specified pregnancy related conditions, third trimester: Secondary | ICD-10-CM | POA: Diagnosis present

## 2021-11-11 LAB — CBC
HCT: 40.2 % (ref 36.0–46.0)
Hemoglobin: 13.4 g/dL (ref 12.0–15.0)
MCH: 29.1 pg (ref 26.0–34.0)
MCHC: 33.3 g/dL (ref 30.0–36.0)
MCV: 87.2 fL (ref 80.0–100.0)
Platelets: 168 10*3/uL (ref 150–400)
RBC: 4.61 MIL/uL (ref 3.87–5.11)
RDW: 12.3 % (ref 11.5–15.5)
WBC: 8.5 10*3/uL (ref 4.0–10.5)
nRBC: 0 % (ref 0.0–0.2)

## 2021-11-11 LAB — TYPE AND SCREEN
ABO/RH(D): O POS
Antibody Screen: NEGATIVE

## 2021-11-11 MED ORDER — ONDANSETRON HCL 4 MG/2ML IJ SOLN: 4.0000 mg | Freq: Four times a day (QID) | INTRAMUSCULAR | Status: DC | PRN

## 2021-11-11 MED ORDER — SIMETHICONE 80 MG PO CHEW: 80.0000 mg | CHEWABLE_TABLET | ORAL | Status: DC | PRN

## 2021-11-11 MED ORDER — ZOLPIDEM TARTRATE 5 MG PO TABS: 5.0000 mg | ORAL_TABLET | Freq: Every evening | ORAL | Status: DC | PRN

## 2021-11-11 MED ORDER — SODIUM CHLORIDE 0.9% FLUSH: 3.0000 mL | Freq: Two times a day (BID) | INTRAVENOUS | Status: DC

## 2021-11-11 MED ORDER — LIDOCAINE HCL (PF) 1 % IJ SOLN
INTRAMUSCULAR | Status: AC
  Filled 2021-11-11: qty 30

## 2021-11-11 MED ORDER — SODIUM CHLORIDE 0.9% FLUSH
3.0000 mL | Freq: Two times a day (BID) | INTRAVENOUS | Status: DC
  Administered 2021-11-11 – 2021-11-13 (×4): 3 mL via INTRAVENOUS

## 2021-11-11 MED ORDER — DIBUCAINE (PERIANAL) 1 % EX OINT
1.0000 | TOPICAL_OINTMENT | CUTANEOUS | Status: DC | PRN
  Filled 2021-11-11: qty 28

## 2021-11-11 MED ORDER — SOD CITRATE-CITRIC ACID 500-334 MG/5ML PO SOLN: 30.0000 mL | ORAL | Status: DC | PRN

## 2021-11-11 MED ORDER — BENZOCAINE-MENTHOL 20-0.5 % EX AERO
1.0000 | INHALATION_SPRAY | CUTANEOUS | Status: DC | PRN
  Administered 2021-11-12: 1 via TOPICAL
  Filled 2021-11-11: qty 56

## 2021-11-11 MED ORDER — MISOPROSTOL 200 MCG PO TABS
ORAL_TABLET | ORAL | Status: AC
  Filled 2021-11-11: qty 4

## 2021-11-11 MED ORDER — WITCH HAZEL-GLYCERIN EX PADS
1.0000 | MEDICATED_PAD | CUTANEOUS | Status: DC | PRN
  Administered 2021-11-12 (×2): 1 via TOPICAL
  Filled 2021-11-11 (×2): qty 100

## 2021-11-11 MED ORDER — IBUPROFEN 600 MG PO TABS
600.0000 mg | ORAL_TABLET | Freq: Four times a day (QID) | ORAL | Status: DC
  Filled 2021-11-11: qty 1

## 2021-11-11 MED ORDER — LACTATED RINGERS IV SOLN: INTRAVENOUS | Status: DC

## 2021-11-11 MED ORDER — SENNOSIDES-DOCUSATE SODIUM 8.6-50 MG PO TABS
2.0000 | ORAL_TABLET | Freq: Every day | ORAL | Status: DC
  Administered 2021-11-12: 2 via ORAL
  Filled 2021-11-11: qty 2

## 2021-11-11 MED ORDER — ACETAMINOPHEN 500 MG PO TABS
1000.0000 mg | ORAL_TABLET | Freq: Four times a day (QID) | ORAL | Status: DC
  Filled 2021-11-11: qty 2

## 2021-11-11 MED ORDER — OXYTOCIN-SODIUM CHLORIDE 30-0.9 UT/500ML-% IV SOLN
2.5000 [IU]/h | INTRAVENOUS | Status: DC
  Filled 2021-11-11: qty 500

## 2021-11-11 MED ORDER — DIPHENHYDRAMINE HCL 25 MG PO CAPS: 25.0000 mg | ORAL_CAPSULE | Freq: Four times a day (QID) | ORAL | Status: DC | PRN

## 2021-11-11 MED ORDER — SODIUM CHLORIDE 0.9 % IV SOLN
5.0000 10*6.[IU] | Freq: Once | INTRAVENOUS | Status: AC
  Administered 2021-11-11: 5 10*6.[IU] via INTRAVENOUS
  Filled 2021-11-11: qty 5

## 2021-11-11 MED ORDER — ACETAMINOPHEN 500 MG PO TABS: 1000.0000 mg | ORAL_TABLET | Freq: Four times a day (QID) | ORAL | Status: DC | PRN

## 2021-11-11 MED ORDER — AMMONIA AROMATIC IN INHA
RESPIRATORY_TRACT | Status: AC
  Filled 2021-11-11: qty 10

## 2021-11-11 MED ORDER — LIDOCAINE HCL (PF) 1 % IJ SOLN: 30.0000 mL | INTRAMUSCULAR | Status: DC | PRN

## 2021-11-11 MED ORDER — LACTATED RINGERS IV SOLN: 500.0000 mL | INTRAVENOUS | Status: DC | PRN

## 2021-11-11 MED ORDER — FERROUS SULFATE 325 (65 FE) MG PO TABS
325.0000 mg | ORAL_TABLET | Freq: Two times a day (BID) | ORAL | Status: DC
  Administered 2021-11-12 – 2021-11-13 (×3): 325 mg via ORAL
  Filled 2021-11-11 (×3): qty 1

## 2021-11-11 MED ORDER — FENTANYL CITRATE (PF) 100 MCG/2ML IJ SOLN: 50.0000 ug | INTRAMUSCULAR | Status: DC | PRN

## 2021-11-11 MED ORDER — SODIUM CHLORIDE 0.9% FLUSH: 3.0000 mL | INTRAVENOUS | Status: DC | PRN

## 2021-11-11 MED ORDER — PRENATAL MULTIVITAMIN CH
1.0000 | ORAL_TABLET | Freq: Every day | ORAL | Status: DC
  Administered 2021-11-12: 1 via ORAL
  Filled 2021-11-11: qty 1

## 2021-11-11 MED ORDER — PENICILLIN G POT IN DEXTROSE 60000 UNIT/ML IV SOLN
3.0000 10*6.[IU] | INTRAVENOUS | Status: DC
  Administered 2021-11-11 (×2): 3 10*6.[IU] via INTRAVENOUS
  Filled 2021-11-11 (×3): qty 50

## 2021-11-11 MED ORDER — COCONUT OIL OIL: 1.0000 | TOPICAL_OIL | Status: DC | PRN

## 2021-11-11 MED ORDER — ONDANSETRON HCL 4 MG/2ML IJ SOLN: 4.0000 mg | INTRAMUSCULAR | Status: DC | PRN

## 2021-11-11 MED ORDER — SODIUM CHLORIDE 0.9 % IV SOLN: 250.0000 mL | INTRAVENOUS | Status: DC | PRN

## 2021-11-11 MED ORDER — ONDANSETRON HCL 4 MG PO TABS: 4.0000 mg | ORAL_TABLET | ORAL | Status: DC | PRN

## 2021-11-11 MED ORDER — OXYTOCIN BOLUS FROM INFUSION
333.0000 mL | Freq: Once | INTRAVENOUS | Status: AC
  Administered 2021-11-11: 333 mL via INTRAVENOUS

## 2021-11-11 MED ORDER — OXYTOCIN 10 UNIT/ML IJ SOLN
INTRAMUSCULAR | Status: AC
  Filled 2021-11-11: qty 2

## 2021-11-11 NOTE — Discharge Instructions (Signed)
Vaginal Delivery, Care After Refer to this sheet in the next few weeks. These discharge instructions provide you with information on caring for yourself after delivery. Your caregiver may also give you specific instructions. Your treatment has been planned according to the most current medical practices available, but problems sometimes occur. Call your caregiver if you have any problems or questions after you go home. HOME CARE INSTRUCTIONS Take over-the-counter or prescription medicines only as directed by your caregiver or pharmacist. Do not drink alcohol, especially if you are breastfeeding or taking medicine to relieve pain. Do not smoke tobacco. Continue to use good perineal care. Good perineal care includes: Wiping your perineum from back to front Keeping your perineum clean. You can do sitz baths twice a day, to help keep this area clean Do not use tampons, douche or have sex until your caregiver says it is okay. Shower only and avoid sitting in submerged water, aside from sitz baths Wear a well-fitting bra that provides breast support. Eat healthy foods. Drink enough fluids to keep your urine clear or pale yellow. Eat high-fiber foods such as whole grain cereals and breads, brown rice, beans, and fresh fruits and vegetables every day. These foods may help prevent or relieve constipation. Avoid constipation with high fiber foods or medications, such as miralax or metamucil Follow your caregiver's recommendations regarding resumption of activities such as climbing stairs, driving, lifting, exercising, or traveling. Talk to your caregiver about resuming sexual activities. Resumption of sexual activities is dependent upon your risk of infection, your rate of healing, and your comfort and desire to resume sexual activity. Try to have someone help you with your household activities and your newborn for at least a few days after you leave the hospital. Rest as much as possible. Try to rest or  take a nap when your newborn is sleeping. Increase your activities gradually. Keep all of your scheduled postpartum appointments. It is very important to keep your scheduled follow-up appointments. At these appointments, your caregiver will be checking to make sure that you are healing physically and emotionally. SEEK MEDICAL CARE IF:  You are passing large clots from your vagina. Save any clots to show your caregiver. You have a foul smelling discharge from your vagina. You have trouble urinating. You are urinating frequently. You have pain when you urinate. You have a change in your bowel movements. You have increasing redness, pain, or swelling near your vaginal incision (episiotomy) or vaginal tear. You have pus draining from your episiotomy or vaginal tear. Your episiotomy or vaginal tear is separating. You have painful, hard, or reddened breasts. You have a severe headache. You have blurred vision or see spots. You feel sad or depressed. You have thoughts of hurting yourself or your newborn. You have questions about your care, the care of your newborn, or medicines. You are dizzy or light-headed. You have a rash. You have nausea or vomiting. You were breastfeeding and have not had a menstrual period within 12 weeks after you stopped breastfeeding. You are not breastfeeding and have not had a menstrual period by the 12th week after delivery. You have a fever. SEEK IMMEDIATE MEDICAL CARE IF:  You have persistent pain. You have chest pain. You have shortness of breath. You faint. You have leg pain. You have stomach pain. Your vaginal bleeding saturates two or more sanitary pads in 1 hour. MAKE SURE YOU:  Understand these instructions. Will watch your condition. Will get help right away if you are not doing well or   get worse. Document Released: 12/22/1999 Document Revised: 05/10/2013 Document Reviewed: 08/21/2011 Exit[employer] Patient Information 2015 [employer]. This  information is not intended to replace advice given to you by your health care provider. Make sure you discuss any questions you have with your health care provider.  Sitz Bath A sitz bath is a warm water bath taken in the sitting position. The water covers only the hips and butt (buttocks). We recommend using one that fits in the toilet, to help with ease of use and cleanliness. It may be used for either healing or cleaning purposes. Sitz baths are also used to relieve pain, itching, or muscle tightening (spasms). The water may contain medicine. Moist heat will help you heal and relax.  HOME CARE  Take 3 to 4 sitz baths a day. Fill the bathtub half-full with warm water. Sit in the water and open the drain a little. Turn on the warm water to keep the tub half-full. Keep the water running constantly. Soak in the water for 15 to 20 minutes. After the sitz bath, pat the affected area dry. GET HELP RIGHT AWAY IF: You get worse instead of better. Stop the sitz baths if you get worse. MAKE SURE YOU: Understand these instructions. Will watch your condition. Will get help right away if you are not doing well or get worse. Document Released: 02/01/2004 Document Revised: 09/18/2011 Document Reviewed: 04/23/2010 Exit[employer] Patient Information 2015 [employer]. This information is not intended to replace advice given to you by your health care provider. Make sure you discuss any questions you have with your health care provider.   

## 2021-11-11 NOTE — Progress Notes (Signed)
L&D Note    Subjective:  Feeling more pressure with contractions, feels like they are more intense and more frequent   Objective:   Vitals:   11/11/21 1144 11/11/21 1459 11/11/21 1500 11/11/21 1707  BP: 111/73  119/73   Pulse: 93  91   Resp: 16 18    Temp: (!) 97.5 F (36.4 C)  98.6 F (37 C) 97.8 F (36.6 C)  TempSrc: Oral  Oral Oral  Weight:      Height:        Current Vital Signs 24h Vital Sign Ranges  T 97.8 F (36.6 C) Temp  Avg: 98 F (36.7 C)  Min: 97.5 F (36.4 C)  Max: 98.6 F (37 C)  BP 119/73 BP  Min: 111/73  Max: 119/73  HR 91 Pulse  Avg: 95.3  Min: 91  Max: 102  RR 18 Resp  Avg: 17  Min: 16  Max: 18  SaO2     No data recorded      Gen: alert, cooperative, no distress Doppler: Baseline: 135 bpm, none Contractions: every 3-6 min, moderate to palpation  SVE: Dilation: 6 Effacement (%): 90 Cervical Position: Anterior Station: Plus 1 Presentation: Vertex Exam by:: Dezerae Freiberger CNM  Medications SCHEDULED MEDICATIONS   ammonia       lidocaine (PF)       misoprostol       oxytocin       oxytocin 40 units in LR 1000 mL  333 mL Intravenous Once   sodium chloride flush  3 mL Intravenous Q12H    MEDICATION INFUSIONS   sodium chloride     lactated ringers     lactated ringers Stopped (11/11/21 1457)   oxytocin     pencillin G potassium IV 3 Million Units (11/11/21 1733)    PRN MEDICATIONS  sodium chloride, acetaminophen, ammonia, fentaNYL (SUBLIMAZE) injection, lactated ringers, lidocaine (PF), lidocaine (PF), misoprostol, ondansetron, oxytocin, sodium chloride flush, sodium citrate-citric acid   Assessment & Plan:  21 y.o. [id number] at [redacted]w[redacted]d admitted for early labor, GBS pos -Labor: Active phase labor. Minimal change in dilation but good descent between exams.  -Fetal Well-being: Category I -GBS: positive - 3rd dose of PCN starting now -Analgesia: unmedicated labor support options   Minda Meo, [place]  11/11/2021 5:37 PM  [facility]

## 2021-11-11 NOTE — Progress Notes (Signed)
L&D Note    Subjective:  Contractions feel more intense and more frequent, starting to feel pressure   Objective:   Vitals:   11/11/21 0843 11/11/21 1144 11/11/21 1459 11/11/21 1500  BP: 119/73 111/73  119/73  Pulse: (!) 102 93  91  Resp: 17 16 18    Temp: 98 F (36.7 C) (!) 97.5 F (36.4 C)  98.6 F (37 C)  TempSrc: Oral Oral  Oral  Weight: 68.5 kg     Height: 5' (1.524 m)       Current Vital Signs 24h Vital Sign Ranges  T 98.6 F (37 C) Temp  Avg: 98 F (36.7 C)  Min: 97.5 F (36.4 C)  Max: 98.6 F (37 C)  BP 119/73 BP  Min: 111/73  Max: 119/73  HR 91 Pulse  Avg: 95.3  Min: 91  Max: 102  RR 18 Resp  Avg: 17  Min: 16  Max: 18  SaO2     No data recorded      Gen: alert, cooperative, no distress Doppler: 135 bpm, no decels  Toco: 3-6 min, palpate mild to moderate  SVE: Dilation: 6 Effacement (%): 90 Cervical Position: Anterior Station: 0 Presentation: Vertex Exam by:: Rontavious Albright CNM  Medications SCHEDULED MEDICATIONS   ammonia       lidocaine (PF)       misoprostol       oxytocin       oxytocin 40 units in LR 1000 mL  333 mL Intravenous Once   sodium chloride flush  3 mL Intravenous Q12H    MEDICATION INFUSIONS   sodium chloride     lactated ringers     lactated ringers Stopped (11/11/21 1457)   oxytocin     pencillin G potassium IV Stopped (11/11/21 1404)    PRN MEDICATIONS  sodium chloride, acetaminophen, ammonia, fentaNYL (SUBLIMAZE) injection, lactated ringers, lidocaine (PF), lidocaine (PF), misoprostol, ondansetron, oxytocin, sodium chloride flush, sodium citrate-citric acid   Assessment & Plan:  21 y.o. [id number] at [redacted]w[redacted]d admitted for early labor, GBS pos -Labor: Active phase labor. -Fetal Well-being: Category I -GBS: positive -2 doses of PCN given -Analgesia: unmedicated labor support options   Minda Meo, CNM  11/11/2021 4:20 PM  [facility]

## 2021-11-11 NOTE — H&P (Signed)
OB History & Physical   History of Present Illness:   Chief Complaint: contractions  HPI:  [name] is a 21 y.o. [phone] female at [redacted]w[redacted]d, Patient's last menstrual period was 02/17/2021 (exact date)., consistent with [place] at [redacted]w[redacted]d, with Estimated Date of Delivery: 11/24/21.  She presents to L&D for contractions that started around 0600 today and have become progressively worse.  She denies LOF.  Reports blood show and endorses good fetal movement.   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  Chlamydia in pregnancy - treated, TOC negative  Elevated 1hr GTT - 3hr WNL   Patient Active Problem List   Diagnosis Date Noted   Labor and delivery, indication for care 11/08/2021   Uterine contractions during pregnancy 11/05/2021   Abdominal pain during pregnancy, third trimester 10/21/2021   Preterm uterine contractions in third trimester, antepartum 10/15/2021   Genital herpes simplex type 1 infection 05/22/2021   History of preterm delivery, currently pregnant in first trimester 05/22/2021   Encounter for supervision of other normal pregnancy, third trimester 04/06/2021   Chlamydia trachomatis infection 03/26/2021   Normal labor 02/23/2019    Prenatal Transfer Tool  Maternal Diabetes: No Genetic Screening: Normal Maternal Ultrasounds/Referrals: Normal Fetal Ultrasounds or other Referrals:  None Maternal Substance Abuse:  No Significant Maternal Medications:  None Significant Maternal Lab Results: Group B Strep positive  Maternal Medical History:   Past Medical History:  Diagnosis Date   Medical history non-contributory     Past Surgical History:  Procedure Laterality Date   NO PAST SURGERIES      No Known Allergies  Prior to Admission medications    Medication Sig Start Date End Date Taking? Authorizing Provider  Prenatal Vit-Fe Fumarate-FA (MULTIVITAMIN-PRENATAL) 27-0.8 MG TABS tablet Take 1 tablet by mouth daily at 12 noon.   Yes [provider]  pantoprazole (PROTONIX) 40 MG tablet Take 1 tablet (40 mg total) by mouth daily. 10/21/21 10/21/22  McVey, Murray Hodgkins, CNM     Prenatal care site:  [facility]  OB History  Gravida Para Term Preterm AB Living  3 1 0 1 1 1   SAB IAB Ectopic Multiple Live Births  1 0 0 0 1    # Outcome Date GA Lbr Len/2nd Weight Sex Delivery Anes PTL Lv  3 Current           2 SAB 01/23/21          1 Preterm 02/23/19 [redacted]w[redacted]d 04:33 / 00:16 2580 g M Vag-Spont None  LIV     Name: [name]     Apgar1: 8  Apgar5: 9     Social History: She  reports that she has never smoked. She has never used smokeless tobacco. She reports that she does not currently use alcohol. She reports that she does not currently use drugs.  Family History: family history is not on file.   Review of Systems: A full review of systems was performed and negative except as noted in the HPI.     Physical Exam:  Vital Signs: BP 119/73 (BP Location: Left Arm)   Pulse (!) 102   Temp 98 F (36.7 C) (Oral)   Resp 17   Ht 5' (1.524 m)   Wt 68.5 kg   LMP 02/17/2021 (Exact Date)  BMI 29.49 kg/m   General: no acute distress.  HEENT: normocephalic, atraumatic Heart: regular rate & rhythm Lungs: normal respiratory effort Abdomen: soft, gravid, non-tender;  EFW: 6 1/2 lbs Pelvic:   External: Normal external female genitalia  Cervix: Dilation: 4.5 / Effacement (%): 80 / Station: -2    Extremities: non-tender, symmetric, No edema bilaterally.  DTRs: 2+/2+  Neurologic: Alert & oriented x 3.    No results found for this or any previous visit (from the past 24 hour(s)).  Pertinent Results:  Prenatal Labs: Blood type/Rh O POS   Antibody screen Negative    Rubella Immune    Varicella Immune   RPR NR    HBsAg NR   Hep C NR   HIV NR    GC neg  Chlamydia neg  Genetic screening cfDNA negative   1 hour GTT 141  3 hour GTT 75, 127, 108, 103  GBS POSITIVE/-- (10/09 1600)    FHT:  FHR: 130 bpm, variability: moderate,  accelerations:  Present,  decelerations:  Absent Category/reactivity:  Category I UC:   regular, every 5-7 minutes   Cephalic by Leopolds and SVE   No results found.  Assessment:  [name] is a 21 y.o. [phone] female at [redacted]w[redacted]d with normal labor.   Plan:  1. Admit to Labor & Delivery - consents reviewed and obtained - Dr. Ouida Sills notified of admission and plan of care   2. Fetal Well being  - Fetal Tracing: cat 1 - Group B Streptococcus ppx indicated: GBS positive  - Presentation: cephalic confirmed by SVE   3. Routine OB: - Prenatal labs reviewed, as above - Rh positive - CBC, T&S, RPR on admit - Regular diet, saline lock  4. Monitoring of labor  - Contractions monitored with external toco - Pelvis proven to 2580g, adequate for trial of labor  - Plan for expectant management  - Augmentation with oxytocin and AROM as appropriate  - Plan for  intermittent fetal monitoring  - Maternal pain control as desired; planning unmedicated labor support options  - Anticipate vaginal delivery  5. Post Partum Planning: - Infant feeding: breast feeding - Contraception:  TBD - Tdap vaccine: Given prenatally - Flu vaccine: Given prenatally  Minda Meo, Mallory Shirk 11/11/21 9:48 AM  Drinda Butts, CNM Certified Nurse Midwife [facility]

## 2021-11-11 NOTE — OB Triage Note (Signed)
CTX since 0700 that have been 5-7 minutes since 0800. +FM. Bloody show. No LOF. VSS.

## 2021-11-11 NOTE — Plan of Care (Signed)
Care plan complete

## 2021-11-11 NOTE — Progress Notes (Signed)
L&D Note    Subjective:  Contractions feel like they've spaced out a little, tolerating well  Objective:   Vitals:   11/11/21 0843 11/11/21 1144  BP: 119/73 111/73  Pulse: (!) 102 93  Resp: 17 16  Temp: 98 F (36.7 C) (!) 97.5 F (36.4 C)  TempSrc: Oral Oral  Weight: 68.5 kg   Height: 5' (1.524 m)     Current Vital Signs 24h Vital Sign Ranges  T (!) 97.5 F (36.4 C) Temp  Avg: 97.8 F (36.6 C)  Min: 97.5 F (36.4 C)  Max: 98 F (36.7 C)  BP 111/73 BP  Min: 111/73  Max: 119/73  HR 93 Pulse  Avg: 97.5  Min: 93  Max: 102  RR 16 Resp  Avg: 16.5  Min: 16  Max: 17  SaO2     No data recorded      Gen: alert, cooperative, no distress FHR: Baseline: 130 bpm, Variability: moderate, Accels: Present, Decels: none Toco: 6-10 min, mild to moderate to palpation SVE: Dilation: 5 Effacement (%): 80 Cervical Position: Anterior Station: -2 Presentation: Vertex Exam by:: Drinda Butts CNM  Medications SCHEDULED MEDICATIONS   ammonia       lidocaine (PF)       misoprostol       oxytocin       oxytocin 40 units in LR 1000 mL  333 mL Intravenous Once   sodium chloride flush  3 mL Intravenous Q12H    MEDICATION INFUSIONS   sodium chloride     lactated ringers     lactated ringers Stopped (11/11/21 1048)   oxytocin     pencillin G potassium IV 3 Million Units (11/11/21 1334)    PRN MEDICATIONS  sodium chloride, acetaminophen, ammonia, fentaNYL (SUBLIMAZE) injection, lactated ringers, lidocaine (PF), lidocaine (PF), misoprostol, ondansetron, oxytocin, sodium chloride flush, sodium citrate-citric acid   Assessment & Plan:  21 y.o. [id number] at [redacted]w[redacted]d admitted for early labor, GBS positive  -Labor: AROM for clear fluid at 1330 -Fetal Well-being: Category I -GBS: positive -2nd dose of PCN started now -Analgesia: unmedicated labor support options   Minda Meo, CNM  11/11/2021 1:39 PM  [facility]

## 2021-11-11 NOTE — Discharge Summary (Signed)
Obstetrical Discharge Summary  Patient Name: [name] DOB: 09-28-2000 MRN: [id number]  Date of Admission: 11/11/2021 Date of Delivery: 11/11/2021 Delivered by: Margaretmary Eddy, CNM  Date of Discharge: 11/13/2021  Primary OB: [facility] [id number] last menstrual period was 02/17/2021 (exact date). 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  Chlamydia in pregnancy - treated, TOC negative  Elevated 1hr GTT - 3hr WNL   Admitting Diagnosis: Normal labor [O80, Z37.9]  Secondary Diagnosis: Patient Active Problem List   Diagnosis Date Noted   Labor and delivery, indication for care 11/08/2021   Uterine contractions during pregnancy 11/05/2021   Abdominal pain during pregnancy, third trimester 10/21/2021   Preterm uterine contractions in third trimester, antepartum 10/15/2021   Genital herpes simplex type 1 infection 05/22/2021   History of preterm delivery, currently pregnant in first trimester 05/22/2021   Encounter for supervision of other normal pregnancy, third trimester 04/06/2021   Chlamydia trachomatis infection 03/26/2021   Normal labor 02/23/2019    Discharge Diagnosis: Term Pregnancy Delivered      Augmentation: AROM Complications: None Intrapartum complications/course: Crescentia presented to L&D in early labor. She was expectantly managed.  Contractions became less frequent. She was augmented with AROM and contractions became progressively worse . She progressed well to C/C/+2 with a strong urge to push.  A variety of pushing positions were used.  Dymon was assisted to hands and knees and she pushed effectively over approximately 30 minutes for a spontaneous vaginal birth.  Delivery Type: spontaneous vaginal delivery Anesthesia: non-pharmacological methods Placenta: spontaneous To Pathology: No   Laceration: none Episiotomy: none Newborn Data: Live born female "[name]" Birth Weight:  7lbs 3.3oz APGAR: 8, 9   Newborn Delivery   Birth date/time: 11/11/2021 20:07:00 Delivery type: Vaginal, Spontaneous      Postpartum Procedures: none Edinburgh:     11/12/2021    3:00 AM 02/23/2019    9:00 PM  Edinburgh Postnatal Depression Scale Screening Tool  I have been able to laugh and see the funny side of things. 0 0  I have looked forward with enjoyment to things. 0 0  I have blamed myself unnecessarily when things went wrong. 0 1  I have been anxious or worried for no good reason. 0 1  I have felt scared or panicky for no good reason. 0 0  Things have been getting on top of me. 0 1  I have been so unhappy that I have had difficulty sleeping. 0 0  I have felt sad or miserable. 0 0  I have been so unhappy that I have been crying. 0 0  The thought of harming myself has occurred to me. 0 0  Edinburgh Postnatal Depression Scale Total 0 3     Post partum course:  Patient had an uncomplicated postpartum course.  By time of discharge on PPD#2, her pain was controlled on oral pain medications; she had appropriate lochia and was ambulating, voiding without difficulty and tolerating regular diet.  She was deemed stable for discharge to home.    Discharge Physical Exam:  BP 108/65 (BP Location: Left Arm)   Pulse 83   Temp 98 F (36.7 C) (Oral)   Resp 18   Ht 5' (1.524 m)   Wt 68.5 kg   LMP 02/17/2021 (Exact Date)   SpO2 99%   Breastfeeding Unknown   BMI 29.49  kg/m   General: NAD CV: RRR Pulm: CTABL, nl effort ABD: s/nd/nt, fundus firm and below the umbilicus Lochia: moderate Perineum: minimal edema/intact DVT Evaluation: LE non-ttp, no evidence of DVT on exam.  Hemoglobin  Date Value Ref Range Status  11/12/2021 12.4 12.0 - 15.0 g/dL Final   HCT  Date Value Ref Range Status  11/12/2021 36.8 36.0 - 46.0 % Final    Risk assessment for postpartum VTE and prophylactic  treatment: Very high risk factors: None High risk factors: None Moderate risk factors: None  Postpartum VTE prophylaxis with LMWH not indicated  Disposition: stable, discharge to home. Baby Feeding: breast and formula feeding Baby Disposition: home with mom  Rh Immune globulin indicated: No Rubella vaccine given: was not indicated Varivax vaccine given: was not indicated Flu vaccine given in AP setting: Yes  Tdap vaccine given in AP setting: Yes   Contraception:  Undecided   Prenatal Labs:  Blood type/Rh O POS   Antibody screen Negative    Rubella Immune    Varicella Immune  RPR NR    HBsAg NR   Hep C NR   HIV NR    GC neg  Chlamydia neg  Genetic screening cfDNA negative   1 hour GTT 141  3 hour GTT 75, 127, 108, 103  GBS POSITIVE/-- (10/09 1600)     Plan:  [name] was discharged to home in good condition. Follow-up appointment with delivering provider in 6 weeks.  Discharge Medications: Allergies as of 11/13/2021   No Known Allergies      Medication List     TAKE these medications    acetaminophen 500 MG tablet Commonly known as: TYLENOL Take 2 tablets (1,000 mg total) by mouth every 6 (six) hours.   ibuprofen 600 MG tablet Commonly known as: ADVIL Take 1 tablet (600 mg total) by mouth every 6 (six) hours as needed for mild pain or cramping.   multivitamin-prenatal 27-0.8 MG Tabs tablet Take 1 tablet by mouth daily at 12 noon.   pantoprazole 40 MG tablet Commonly known as: Protonix Take 1 tablet (40 mg total) by mouth daily.         Follow-up Information     Minda Meo, CNM. Schedule an appointment as soon as possible for a visit in 6 week(s).   Specialty: Certified Nurse Midwife Why: postpartum visit Contact information: [address] [address] [phone]                 Signed:  Drinda Butts, CNM Certified Nurse Midwife [facility]

## 2021-11-12 LAB — CBC
HCT: 36.8 % (ref 36.0–46.0)
Hemoglobin: 12.4 g/dL (ref 12.0–15.0)
MCH: 29.7 pg (ref 26.0–34.0)
MCHC: 33.7 g/dL (ref 30.0–36.0)
MCV: 88 fL (ref 80.0–100.0)
Platelets: 177 10*3/uL (ref 150–400)
RBC: 4.18 MIL/uL (ref 3.87–5.11)
RDW: 12.3 % (ref 11.5–15.5)
WBC: 11.8 10*3/uL — ABNORMAL HIGH (ref 4.0–10.5)
nRBC: 0 % (ref 0.0–0.2)

## 2021-11-12 LAB — RPR: RPR Ser Ql: NONREACTIVE

## 2021-11-12 NOTE — Lactation Note (Signed)
This note was copied from a baby's chart. Lactation Consultation Note  Patient Name: [name] [name] Date: 11/12/2021 Reason for consult: Initial assessment;Early term 37-38.6wks Age:22 hours  Maternal Data Has patient been taught Hand Expression?: Yes Does the patient have breastfeeding experience prior to this delivery?: Yes How long did the patient breastfeed?: 2 months  P2, SVD 14 hours ago. Mom opts for breast and bottle, had similar feeding plan with first child.   Feeding Mother's Current Feeding Choice: Breast Milk and Formula  Mom reports offering the breast first, and states that the baby does latch but may not stay the whole time or will fall asleep, then mom will offer the bottle of formula.  LATCH Score   Lactation Tools Discussed/Used    Interventions Interventions: Breast feeding basics reviewed;Hand express;Pace feeding;Education  We discussed feeding plan and goals. Mom plans to follow same feeding plan as first child. She will continue to offer the breast first- no questions/concerns with latching- and then formula if baby doesn't feed well from the breast.  Reviewed newborn stomach size, appropriate volumes, and pace bottle feeding.  Discharge    Consult Status Consult Status: PRN  Whiteboard updated, mom to call if she needs assistance.  [name] 11/12/2021, 10:52 AM

## 2021-11-12 NOTE — Plan of Care (Signed)
Transferred to Room 337 PP. Alert and oriented with pleasant affect. Color good, skin W&D. Orientation to Room, Safety and Security and Fall Prevention  begun. Pt. V/O. Education initiated.

## 2021-11-13 MED ORDER — IBUPROFEN 600 MG PO TABS: 600.0000 mg | ORAL_TABLET | Freq: Four times a day (QID) | ORAL | 0 refills | Status: AC | PRN

## 2021-11-13 MED ORDER — ACETAMINOPHEN 500 MG PO TABS: 1000.0000 mg | ORAL_TABLET | Freq: Four times a day (QID) | ORAL | 0 refills | Status: AC

## 2021-11-13 NOTE — Progress Notes (Signed)
Patient discharged home with family.  Discharge instructions, when to follow up, and prescriptions reviewed with patient.  Patient verbalized understanding. Patient will be escorted out by auxiliary.   

## 2023-04-17 IMAGING — US US OB < 14 WEEKS - US OB TV
1 series · 15 of 28 positions shown · non-contrast
Comparison: None for this gestation

CLINICAL DATA: Threatened miscarriage, LMP 09/19/2021, vaginal
bleeding for 1 week. Quantitative beta HCG 191 on 01/25/2021,
today

EXAM:
OBSTETRIC <14 WK US AND TRANSVAGINAL OB US
TECHNIQUE: Both transabdominal and transvaginal ultrasound examinations were
performed for complete evaluation of the gestation as well as the
maternal uterus, adnexal regions, and pelvic cul-de-sac.
Transvaginal technique was performed to assess early pregnancy.

[Series 1: us ob comp less 14 wks · 15 of 110 slices shown]
[im 1/110]
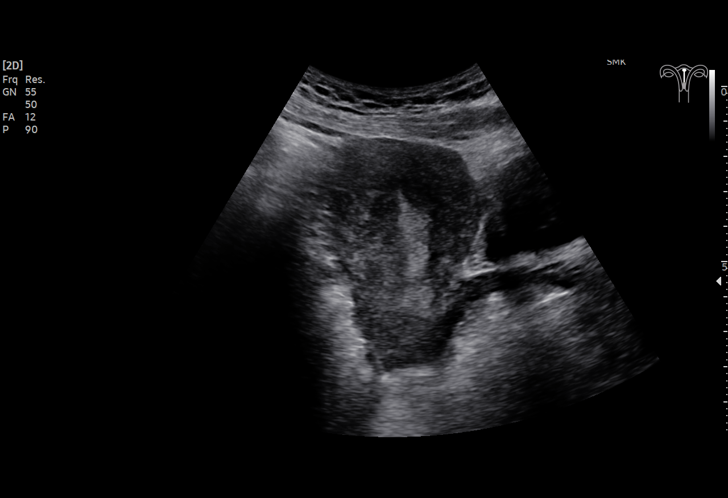
[im 9/110]
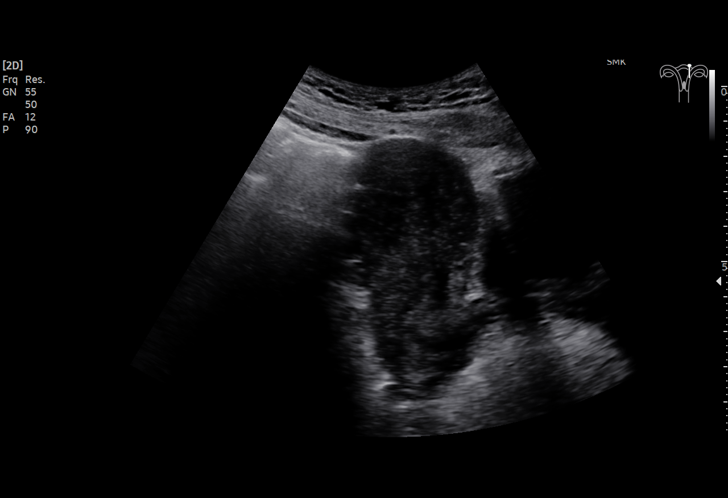
[im 17/110]
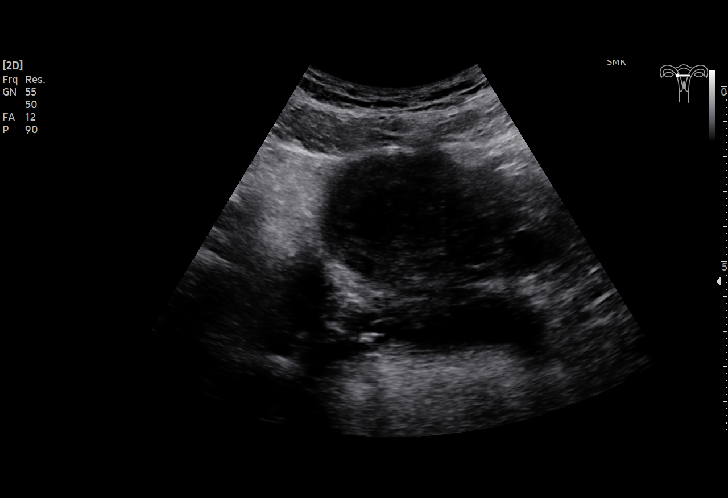
[im 25/110]
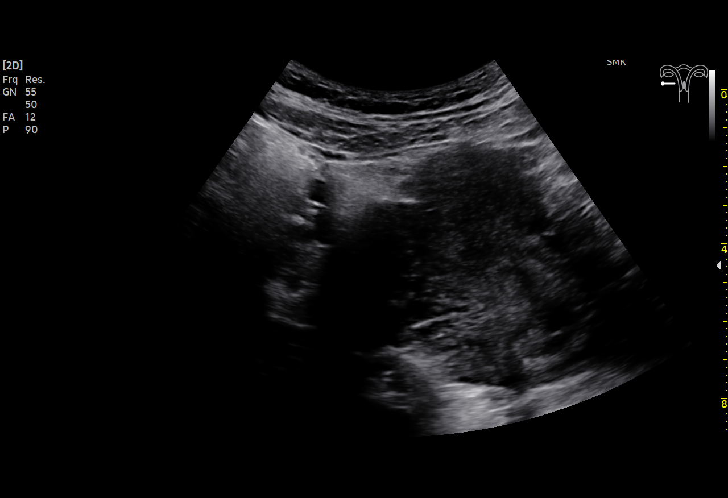
[im 33/110]
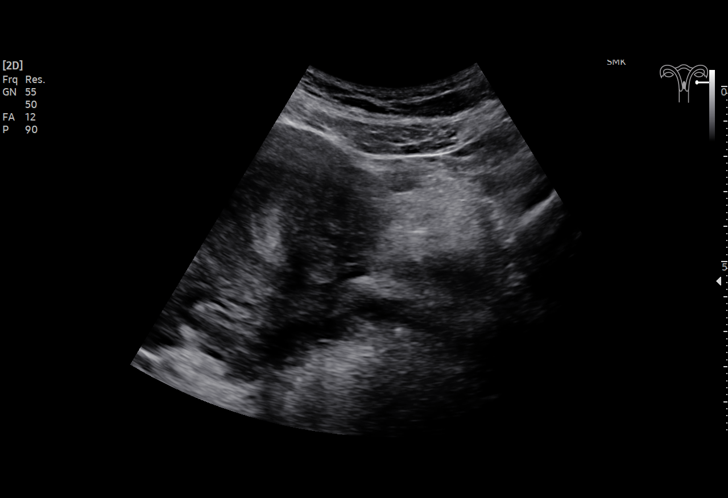
[im 41/110]
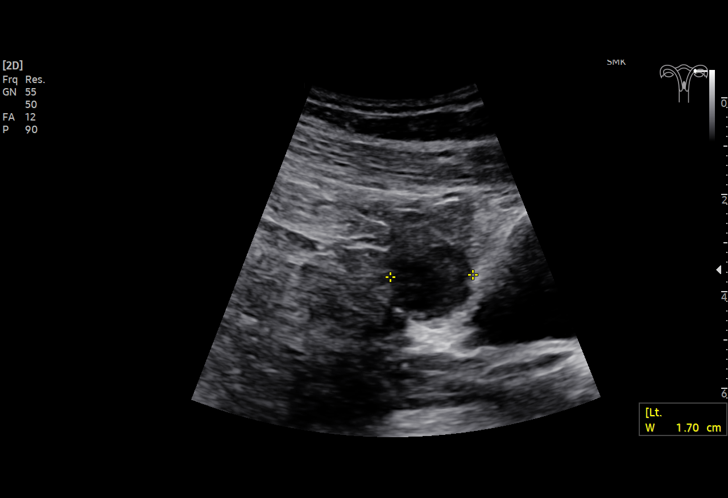
[im 49/110]
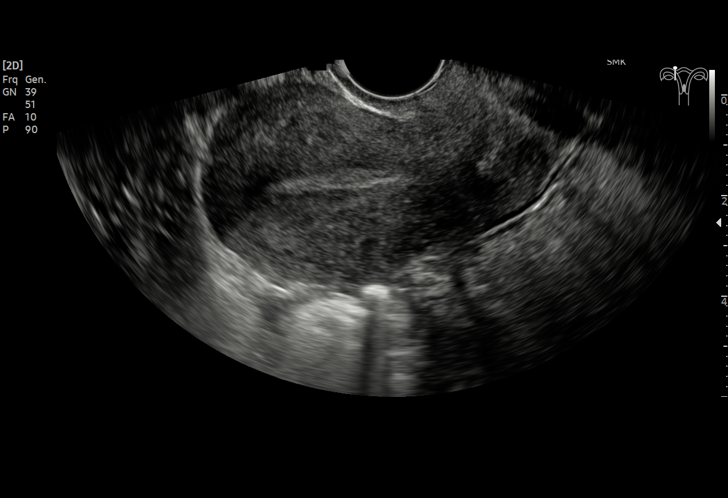
[im 57/110]
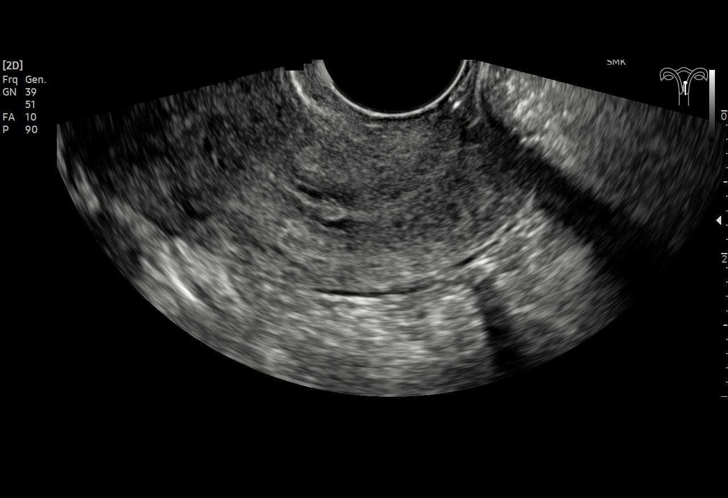
[im 61/110]
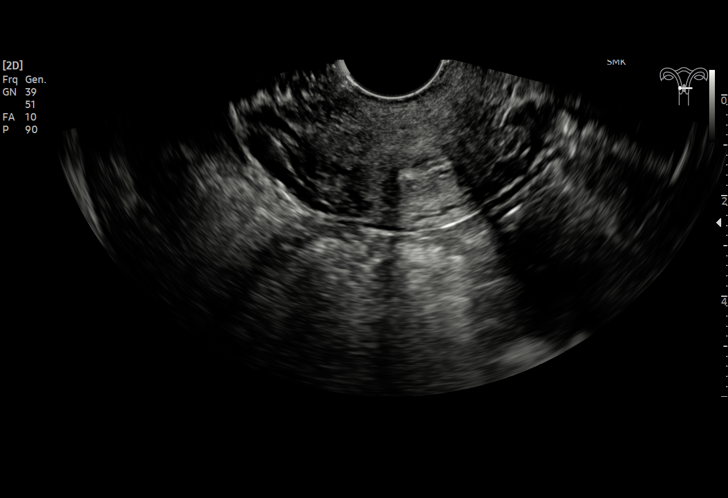
[im 69/110]
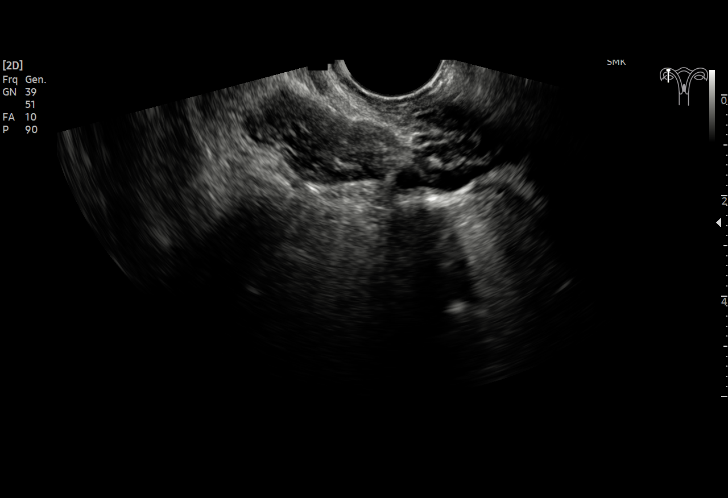
[im 77/110]
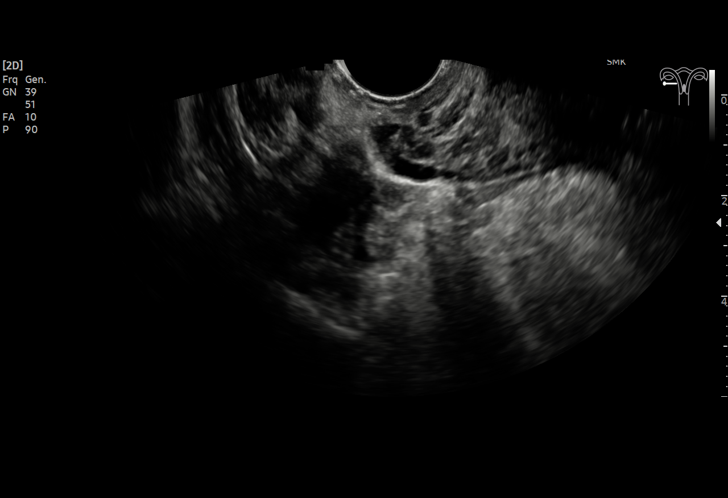
[im 85/110]
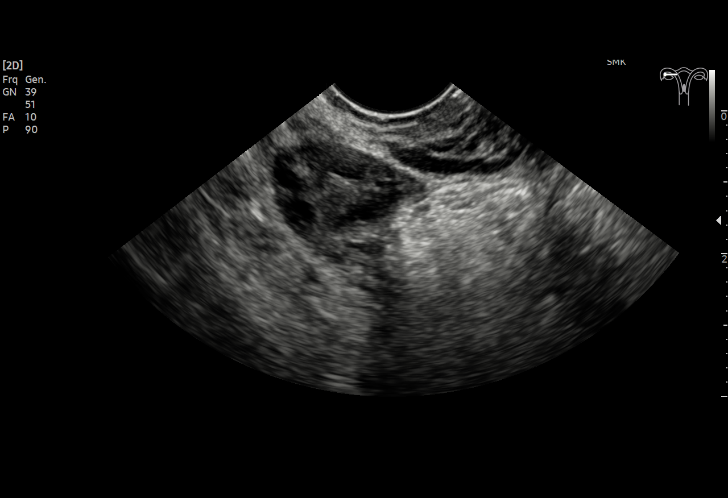
[im 93/110]
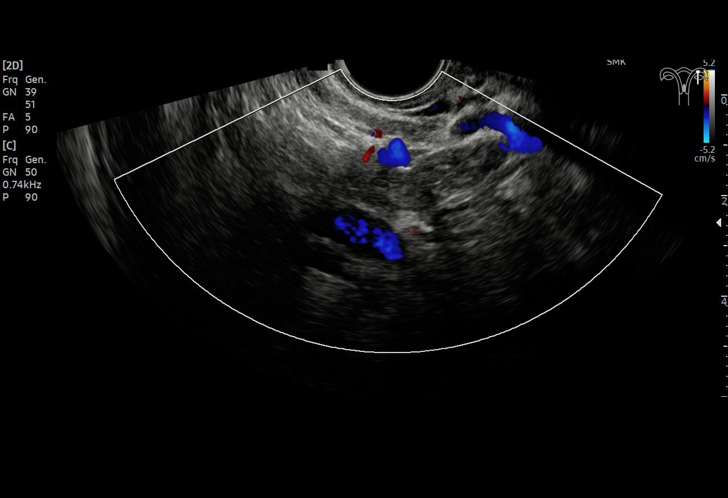
[im 101/110]
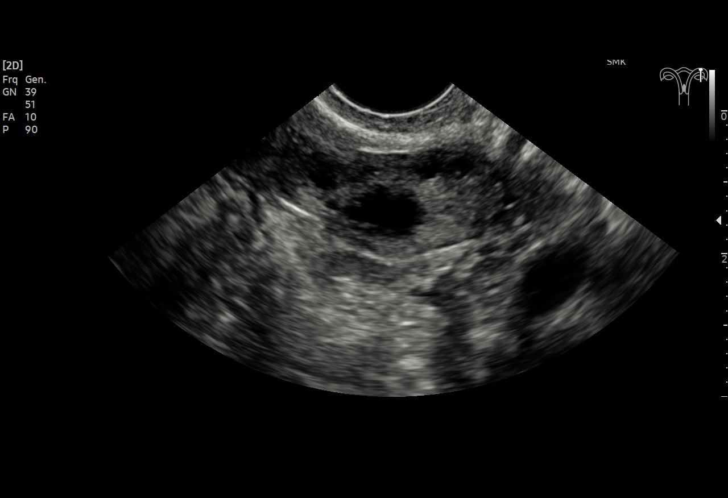
[im 110/110]
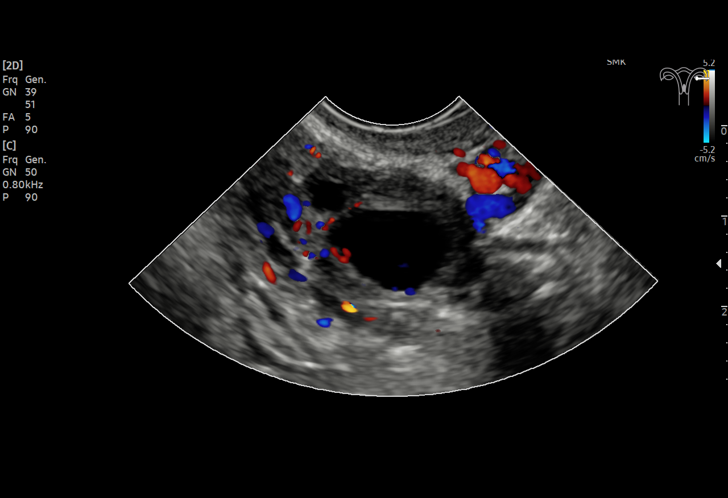

[15 of 28 positions shown; findings below may reference images not displayed]

FINDINGS: Intrauterine gestational sac: Absent

Yolk sac:  N/A

Embryo:  N/A

Cardiac Activity: N/A

Heart Rate: N/A  bpm

MSD:   mm    w     d

CRL:    mm    w    d                  US EDC:

Subchorionic hemorrhage:  N/A

Maternal uterus/adnexae:

Uterus anteverted, normal appearance.

No focal uterine mass or gestational sac.

No endometrial fluid or thickening.

RIGHT ovary normal size and morphology, 3.3 x 1.6 x 1.8 cm.

LEFT ovary measures 3.5 x 1.4 x 1.9 cm and contains a corpus luteal
cyst.

No free pelvic fluid or adnexal masses.
IMPRESSION: No intrauterine gestation identified.

No intrauterine gestation identified.

Findings are consistent with pregnancy of unknown location.

Differential diagnosis includes early intrauterine pregnancy too
early to visualize, spontaneous abortion, and ectopic pregnancy.

In the setting of falling quantitative beta HCG, findings are most
suggestive of spontaneous abortion; recommend continued serial
quantitative beta HCG to confirm failed pregnancy.
